# Patient Record
Sex: Female | Born: 1945 | Marital: Married | Smoking: Never smoker
Health system: Northeastern US, Academic
[De-identification: ages and names within clinical notes are randomized; demographics above are authoritative.]

## PROBLEM LIST (undated history)

## (undated) DIAGNOSIS — R188 Other ascites: Secondary | ICD-10-CM

## (undated) DIAGNOSIS — R32 Unspecified urinary incontinence: Secondary | ICD-10-CM

## (undated) DIAGNOSIS — E119 Type 2 diabetes mellitus without complications: Secondary | ICD-10-CM

## (undated) DIAGNOSIS — I10 Essential (primary) hypertension: Secondary | ICD-10-CM

## (undated) DIAGNOSIS — K649 Unspecified hemorrhoids: Secondary | ICD-10-CM

## (undated) DIAGNOSIS — I5189 Other ill-defined heart diseases: Secondary | ICD-10-CM

## (undated) DIAGNOSIS — E785 Hyperlipidemia, unspecified: Secondary | ICD-10-CM

## (undated) DIAGNOSIS — I2510 Atherosclerotic heart disease of native coronary artery without angina pectoris: Secondary | ICD-10-CM

## (undated) DIAGNOSIS — I259 Chronic ischemic heart disease, unspecified: Secondary | ICD-10-CM

## (undated) DIAGNOSIS — N8110 Cystocele, unspecified: Secondary | ICD-10-CM

## (undated) DIAGNOSIS — K219 Gastro-esophageal reflux disease without esophagitis: Secondary | ICD-10-CM

## (undated) DIAGNOSIS — I209 Angina pectoris, unspecified: Secondary | ICD-10-CM

## (undated) DIAGNOSIS — Z9289 Personal history of other medical treatment: Secondary | ICD-10-CM

## (undated) DIAGNOSIS — K7460 Unspecified cirrhosis of liver: Secondary | ICD-10-CM

## (undated) DIAGNOSIS — K228 Other specified diseases of esophagus: Secondary | ICD-10-CM

## (undated) DIAGNOSIS — K2289 Other specified disease of esophagus: Secondary | ICD-10-CM

## (undated) HISTORY — PX: TUBAL LIGATION: SHX77

## (undated) HISTORY — DX: Hyperlipidemia, unspecified: E78.5

## (undated) HISTORY — DX: Other ascites: R18.8

## (undated) HISTORY — DX: Unspecified urinary incontinence: R32

## (undated) HISTORY — DX: Personal history of other medical treatment: Z92.89

## (undated) HISTORY — DX: Atherosclerotic heart disease of native coronary artery without angina pectoris: I25.10

## (undated) HISTORY — DX: Gastro-esophageal reflux disease without esophagitis: K21.9

## (undated) HISTORY — DX: Angina pectoris, unspecified: I20.9

## (undated) HISTORY — DX: Unspecified hemorrhoids: K64.9

## (undated) HISTORY — DX: Essential (primary) hypertension: I10

## (undated) HISTORY — DX: Cystocele, unspecified: N81.10

## (undated) HISTORY — DX: Type 2 diabetes mellitus without complications: E11.9

## (undated) HISTORY — DX: Chronic ischemic heart disease, unspecified: I25.9

## (undated) HISTORY — DX: Other ill-defined heart diseases: I51.89

---

## 2014-08-20 ENCOUNTER — Emergency Department (HOSPITAL_COMMUNITY)
Admission: EM | Admit: 2014-08-20 | Discharge: 2014-08-20 | Disposition: A | Discharge status: Home / Self Care | Payer: Medicare Other | Attending: Emergency Medicine | Admitting: Emergency Medicine

## 2014-08-20 ENCOUNTER — Encounter (HOSPITAL_COMMUNITY): Payer: Self-pay

## 2014-08-20 DIAGNOSIS — H532 Diplopia: Secondary | ICD-10-CM | POA: Insufficient documentation

## 2014-08-20 DIAGNOSIS — E119 Type 2 diabetes mellitus without complications: Secondary | ICD-10-CM | POA: Insufficient documentation

## 2014-08-20 DIAGNOSIS — Z7982 Long term (current) use of aspirin: Secondary | ICD-10-CM | POA: Insufficient documentation

## 2014-08-20 DIAGNOSIS — H5712 Ocular pain, left eye: Secondary | ICD-10-CM | POA: Diagnosis present

## 2014-08-20 DIAGNOSIS — H547 Unspecified visual loss: Secondary | ICD-10-CM | POA: Diagnosis not present

## 2014-08-20 DIAGNOSIS — Z79899 Other long term (current) drug therapy: Secondary | ICD-10-CM | POA: Insufficient documentation

## 2014-08-20 DIAGNOSIS — Z794 Long term (current) use of insulin: Secondary | ICD-10-CM | POA: Diagnosis not present

## 2014-08-20 HISTORY — DX: Type 2 diabetes mellitus without complications: E11.9

## 2014-08-20 MED ORDER — PROPARACAINE HCL 0.5 % OP SOLN
1.0000 [drp] | Freq: Once | OPHTHALMIC | Status: DC
  Filled 2014-08-20: qty 15

## 2014-08-20 MED ORDER — TRAMADOL HCL 50 MG PO TABS: 50.0000 mg | ORAL_TABLET | Freq: Four times a day (QID) | ORAL | Status: DC | PRN

## 2014-08-20 MED ORDER — TRAMADOL HCL 50 MG PO TABS
50.0000 mg | ORAL_TABLET | Freq: Once | ORAL | Status: AC
  Administered 2014-08-20: 50 mg via ORAL
  Filled 2014-08-20: qty 1

## 2014-08-20 MED ORDER — TETRACAINE HCL 0.5 % OP SOLN
OPHTHALMIC | Status: AC
  Administered 2014-08-20: 19:00:00
  Filled 2014-08-20: qty 2

## 2014-08-20 NOTE — ED Provider Notes (Signed)
Medical screening examination/treatment/procedure(s) were conducted as a shared visit with non-physician practitioner(s) and myself.  I personally evaluated the patient during the encounter.   EKG Interpretation None      Patient seen by me. Patient does not wear contacts. Patient with complaint of discomfort to the left eye with some redness. No discharge. No history of foreign body. Symptoms started yesterday. Patient's vision in the right eyes 20/20 left eye 20/25 that is with her glasses on. Ocular pressures are below 20 and both eyes. No evidence of any corneal abrasion. Will refer patient to the ophthalmology. Patient will be treated with tramadol in the meantime for the eye discomfort. Exact cause not clear at this point in time.  Vanetta MuldersScott Micky Sheller, MD 08/20/14 2004

## 2014-08-20 NOTE — ED Provider Notes (Signed)
CSN: [id number]     Arrival date & time 08/20/14  1555 History  This chart was scribed for non-physician practitioner Burgess AmorJulie Nashon Erbes, PA-C working with Vanetta MuldersScott Zackowski, MD by Littie Deedsichard Sun, ED Scribe. This patient was seen in room APFT20/APFT20 and the patient's care was started at 6:01 PM.     Chief Complaint  Patient presents with  . Eye Pain    The history is provided by the patient. No language interpreter was used.   HPI Comments: Lisa Sanford is a 68 y.o. female with glasses and a hx of DM who presents to the Emergency Department complaining of gradual onset, aching, burning left eye pain with redness that began yesterday. Her eye pain was described as throbbing yesterday, but it is now described as aching. She also reports an occasional shooting pain to her left eye when she turns her head. She states her eyes feel "gooey" when she blinks. Patient has had loss of vision and double vision that lasted a few days about 1 year ago; she did see an eye specialist at that time who had concern for possible early glaucoma and cataracts. Patient denies eye drainage, eye itchiness, photophobia, feeling any foreign bodies in her eye, and vision changes. She denies any injury to her eyes. She has NKDA. [place] but she has been here since September visiting her daughter and plans to stay until March.   Past Medical History  Diagnosis Date  . Diabetes mellitus without complication    Past Surgical History  Procedure Laterality Date  . Tubal ligation     No family history on file. History  Substance Use Topics  . Smoking status: Never Smoker   . Smokeless tobacco: Not on file  . Alcohol Use: No   OB History    No data available     Review of Systems  Constitutional: Negative for fever.  HENT: Negative for congestion and sore throat.   Eyes: Positive for pain and redness. Negative for photophobia, discharge, itching and visual disturbance.  Respiratory: Negative for chest  tightness and shortness of breath.   Cardiovascular: Negative for chest pain.  Gastrointestinal: Negative for nausea and abdominal pain.  Genitourinary: Negative.   Musculoskeletal: Negative for joint swelling, arthralgias and neck pain.  Skin: Negative.  Negative for rash and wound.  Neurological: Negative for dizziness, weakness, light-headedness, numbness and headaches.  Psychiatric/Behavioral: Negative.       Allergies  Review of patient's allergies indicates no known allergies.  Home Medications   Prior to Admission medications   Medication Sig Start Date End Date Taking? Authorizing Provider  aspirin EC 81 MG tablet Take 81 mg by mouth daily.   Yes Historical Provider, MD  gabapentin (NEURONTIN) 100 MG capsule Take 200 mg by mouth 3 (three) times daily.   Yes Historical Provider, MD  insulin glargine (LANTUS) 100 UNIT/ML injection Inject 45 Units into the skin 2 (two) times daily.   Yes Historical Provider, MD  isosorbide mononitrate (IMDUR) 30 MG 24 hr tablet Take 30 mg by mouth daily.   Yes Historical Provider, MD  lisinopril (PRINIVIL,ZESTRIL) 10 MG tablet Take 10 mg by mouth daily.   Yes Historical Provider, MD  metFORMIN (GLUCOPHAGE) 850 MG tablet Take 850 mg by mouth 3 (three) times daily.   Yes Historical Provider, MD  metoprolol tartrate (LOPRESSOR) 25 MG tablet Take 12.5 mg by mouth 2 (two) times daily.   Yes Historical Provider, MD  omeprazole (PRILOSEC) 20 MG capsule Take 20 mg  by mouth daily.   Yes Historical Provider, MD  oxybutynin (DITROPAN) 5 MG tablet Take 5 mg by mouth 3 (three) times daily.   Yes Historical Provider, MD  traMADol (ULTRAM) 50 MG tablet Take 1 tablet (50 mg total) by mouth every 6 (six) hours as needed. 08/20/14   Burgess AmorJulie Oseph Imburgia, PA-C   BP 123/82 mmHg  Pulse 86  Temp(Src) 98.6 F (37 C) (Oral)  Resp 20  Ht 5\' 2"  (1.575 m)  Wt 191 lb (86.637 kg)  BMI 34.93 kg/m2  SpO2 98% Physical Exam  Constitutional: She appears well-developed and  well-nourished.  HENT:  Head: Normocephalic and atraumatic.  Eyes: Pupils are equal, round, and reactive to light. Left eye exhibits no chemosis. Left conjunctiva is injected. Left conjunctiva has no hemorrhage. Right eye exhibits normal extraocular motion and no nystagmus. Left eye exhibits normal extraocular motion and no nystagmus.  Slit lamp exam:      The left eye shows no corneal abrasion, no corneal flare, no corneal ulcer, no foreign body and no hyphema.  Eye pressure left 12 and 14,  Right 6 and 9.  Neck: Normal range of motion.  Cardiovascular: Normal rate, regular rhythm, normal heart sounds and intact distal pulses.   Pulmonary/Chest: Effort normal and breath sounds normal. She has no wheezes.  Abdominal: Soft. Bowel sounds are normal. There is no tenderness.  Musculoskeletal: Normal range of motion.  Neurological: She is alert.  Skin: Skin is warm and dry.  Psychiatric: She has a normal mood and affect.  Nursing note and vitals reviewed.   ED Course  Procedures  DIAGNOSTIC STUDIES: Oxygen Saturation is 99% on room air, normal by my interpretation.    COORDINATION OF CARE:    Labs Review Labs Reviewed - No data to display  Imaging Review No results found.   EKG Interpretation None      MDM   Final diagnoses:  Eye pain, left    Pt with left eye pain of unclear etiology.  No trauma, no glaucoma per todays pressure reading.  She was given tramadol for pain relief, advised she needs a recheck by an opthalmologist.  She was referred to Dr. Lita MainsHaines and she knows to call in the am for an appt.   Pt was seen by Dr. Deretha EmoryZackowski during this visit.  I personally performed the services described in this documentation, which was scribed in my presence. The recorded information has been reviewed and is accurate.   Burgess AmorJulie Jerzy Crotteau, PA-C 08/21/14 1335

## 2014-08-20 NOTE — ED Notes (Signed)
Pain, redness lt eye,  No d/c,  No known injury  Rt eye 20/20  Lt eye 20/25  Both 20/25  With glasses.

## 2014-08-20 NOTE — ED Notes (Signed)
Pt c/o left eye irritation and pain since yesterday.  Sclera red, denies drainage.

## 2014-08-20 NOTE — Discharge Instructions (Signed)
You may use the tramadol if needed for pain - use caution as this may make you sleepy.  Call Dr Lita MainsHaines in the morning as discussed for further evaluation of your eye.  Your exam and pressures are normal tonight (no sign of glaucoma)

## 2014-11-13 ENCOUNTER — Other Ambulatory Visit: Payer: Self-pay | Admitting: Gastroenterology

## 2014-12-04 ENCOUNTER — Encounter: Payer: Self-pay | Admitting: Gastroenterology

## 2015-01-14 ENCOUNTER — Other Ambulatory Visit: Payer: Self-pay | Admitting: Gastroenterology

## 2015-10-01 ENCOUNTER — Emergency Department (HOSPITAL_COMMUNITY): Payer: Medicare Other

## 2015-10-01 ENCOUNTER — Emergency Department (HOSPITAL_COMMUNITY)
Admission: EM | Admit: 2015-10-01 | Discharge: 2015-10-01 | Disposition: A | Discharge status: Home / Self Care | Payer: Medicare Other | Attending: Emergency Medicine | Admitting: Emergency Medicine

## 2015-10-01 ENCOUNTER — Encounter (HOSPITAL_COMMUNITY): Payer: Self-pay | Admitting: *Deleted

## 2015-10-01 DIAGNOSIS — M791 Myalgia: Secondary | ICD-10-CM | POA: Insufficient documentation

## 2015-10-01 DIAGNOSIS — Z7984 Long term (current) use of oral hypoglycemic drugs: Secondary | ICD-10-CM | POA: Insufficient documentation

## 2015-10-01 DIAGNOSIS — R39198 Other difficulties with micturition: Secondary | ICD-10-CM | POA: Diagnosis present

## 2015-10-01 DIAGNOSIS — Z794 Long term (current) use of insulin: Secondary | ICD-10-CM | POA: Insufficient documentation

## 2015-10-01 DIAGNOSIS — N390 Urinary tract infection, site not specified: Secondary | ICD-10-CM | POA: Insufficient documentation

## 2015-10-01 DIAGNOSIS — Z79899 Other long term (current) drug therapy: Secondary | ICD-10-CM | POA: Diagnosis not present

## 2015-10-01 DIAGNOSIS — Z8719 Personal history of other diseases of the digestive system: Secondary | ICD-10-CM | POA: Insufficient documentation

## 2015-10-01 DIAGNOSIS — E119 Type 2 diabetes mellitus without complications: Secondary | ICD-10-CM | POA: Insufficient documentation

## 2015-10-01 DIAGNOSIS — Z7982 Long term (current) use of aspirin: Secondary | ICD-10-CM | POA: Diagnosis not present

## 2015-10-01 HISTORY — DX: Unspecified cirrhosis of liver: K74.60

## 2015-10-01 HISTORY — DX: Other specified diseases of esophagus: K22.8

## 2015-10-01 HISTORY — DX: Other specified disease of esophagus: K22.89

## 2015-10-01 LAB — CBC WITH DIFFERENTIAL/PLATELET
Basophils Absolute: 0 10*3/uL (ref 0.0–0.1)
Basophils Relative: 0 %
Eosinophils Absolute: 0.1 10*3/uL (ref 0.0–0.7)
Eosinophils Relative: 1 %
HCT: 34.7 % — ABNORMAL LOW (ref 36.0–46.0)
HEMOGLOBIN: 10.9 g/dL — AB (ref 12.0–15.0)
LYMPHS ABS: 1.2 10*3/uL (ref 0.7–4.0)
LYMPHS PCT: 25 %
MCH: 24.4 pg — AB (ref 26.0–34.0)
MCHC: 31.4 g/dL (ref 30.0–36.0)
MCV: 77.6 fL — AB (ref 78.0–100.0)
MONOS PCT: 12 %
Monocytes Absolute: 0.6 10*3/uL (ref 0.1–1.0)
NEUTROS ABS: 3 10*3/uL (ref 1.7–7.7)
NEUTROS PCT: 62 %
Platelets: 133 10*3/uL — ABNORMAL LOW (ref 150–400)
RBC: 4.47 MIL/uL (ref 3.87–5.11)
RDW: 16.8 % — ABNORMAL HIGH (ref 11.5–15.5)
WBC: 4.9 10*3/uL (ref 4.0–10.5)

## 2015-10-01 LAB — URINALYSIS, ROUTINE W REFLEX MICROSCOPIC
Bilirubin Urine: NEGATIVE
Glucose, UA: NEGATIVE mg/dL
Hgb urine dipstick: NEGATIVE
Ketones, ur: NEGATIVE mg/dL
Nitrite: NEGATIVE
PH: 5.5 (ref 5.0–8.0)
Protein, ur: NEGATIVE mg/dL
SPECIFIC GRAVITY, URINE: 1.02 (ref 1.005–1.030)

## 2015-10-01 LAB — COMPREHENSIVE METABOLIC PANEL
ALBUMIN: 3.3 g/dL — AB (ref 3.5–5.0)
ALT: 21 U/L (ref 14–54)
ANION GAP: 11 (ref 5–15)
AST: 29 U/L (ref 15–41)
Alkaline Phosphatase: 81 U/L (ref 38–126)
BUN: 18 mg/dL (ref 6–20)
CHLORIDE: 97 mmol/L — AB (ref 101–111)
CO2: 30 mmol/L (ref 22–32)
Calcium: 9.2 mg/dL (ref 8.9–10.3)
Creatinine, Ser: 0.77 mg/dL (ref 0.44–1.00)
GFR calc Af Amer: >60 mL/min (ref >60)
GFR calc non Af Amer: >60 mL/min (ref >60)
GLUCOSE: 106 mg/dL — AB (ref 65–99)
POTASSIUM: 4 mmol/L (ref 3.5–5.1)
Sodium: 138 mmol/L (ref 135–145)
Total Bilirubin: 0.4 mg/dL (ref 0.3–1.2)
Total Protein: 7.9 g/dL (ref 6.5–8.1)

## 2015-10-01 LAB — URINE MICROSCOPIC-ADD ON: RBC / HPF: NONE SEEN RBC/hpf (ref 0–5)

## 2015-10-01 MED ORDER — CEPHALEXIN 500 MG PO CAPS: 500.0000 mg | ORAL_CAPSULE | Freq: Four times a day (QID) | ORAL | Status: AC

## 2015-10-01 NOTE — ED Notes (Signed)
Drank a cup of water without difficulty.  Ambulated without difficulty.

## 2015-10-01 NOTE — ED Provider Notes (Signed)
CSN: [id number]     Arrival date & time 10/01/15  1326 History   First MD Initiated Contact with Patient 10/01/15 1617     Chief Complaint  Patient presents with  . Urinary Tract Infection     (Consider location/radiation/quality/duration/timing/severity/associated sxs/prior Treatment) HPI Comments:  Patient reports 4 day history of body aches, chills, burning with urination. States she is urinating a lot. Dysuria improved after having cranberry juice. No fevers but has had chills. No vomiting or diarrhea. No significant abdominal pain, chest pain or shortness of breath. History of diabetes, cirrhosis of unknown etiology. Denies any chest pain or shortness of breath. Denies any leg pain or swelling. No recent travel. No sick contacts. Did not receive a flu shot. Denies any pain right now just generalized body aches.  The history is provided by the patient and a relative.    Past Medical History  Diagnosis Date  . Diabetes mellitus without complication (HCC)   . Esophageal bleeding   . Cirrhosis [facility])    Past Surgical History  Procedure Laterality Date  . Tubal ligation     History reviewed. No pertinent family history. Social History  Substance Use Topics  . Smoking status: Never Smoker   . Smokeless tobacco: None  . Alcohol Use: No   OB History    No data available     Review of Systems  Constitutional: Positive for fatigue. Negative for fever, activity change and appetite change.  HENT: Negative for congestion and rhinorrhea.   Respiratory: Negative for cough, chest tightness and shortness of breath.   Gastrointestinal: Negative for nausea, vomiting and abdominal pain.  Genitourinary: Positive for dysuria and urgency. Negative for hematuria.  Musculoskeletal: Positive for myalgias and arthralgias.  Skin: Negative for rash.  Neurological: Negative for dizziness, weakness and headaches.  A complete 10 system review of systems was obtained and all systems are negative  except as noted in the HPI and PMH.      Allergies  Review of patient's allergies indicates no known allergies.  Home Medications   Prior to Admission medications   Medication Sig Start Date End Date Taking? Authorizing Provider  aspirin EC 81 MG tablet Take 81 mg by mouth daily.    Historical Provider, MD  cephALEXin (KEFLEX) 500 MG capsule Take 1 capsule (500 mg total) by mouth 4 (four) times daily. 10/01/15   Glynn Octave, MD  gabapentin (NEURONTIN) 100 MG capsule Take 200 mg by mouth 3 (three) times daily.    Historical Provider, MD  insulin glargine (LANTUS) 100 UNIT/ML injection Inject 45 Units into the skin 2 (two) times daily.    Historical Provider, MD  isosorbide mononitrate (IMDUR) 30 MG 24 hr tablet Take 30 mg by mouth daily.    Historical Provider, MD  lisinopril (PRINIVIL,ZESTRIL) 10 MG tablet Take 10 mg by mouth daily.    Historical Provider, MD  metFORMIN (GLUCOPHAGE) 850 MG tablet Take 850 mg by mouth 3 (three) times daily.    Historical Provider, MD  metoprolol tartrate (LOPRESSOR) 25 MG tablet Take 12.5 mg by mouth 2 (two) times daily.    Historical Provider, MD  omeprazole (PRILOSEC) 20 MG capsule Take 20 mg by mouth daily.    Historical Provider, MD  oxybutynin (DITROPAN) 5 MG tablet Take 5 mg by mouth 3 (three) times daily.    Historical Provider, MD  traMADol (ULTRAM) 50 MG tablet Take 1 tablet (50 mg total) by mouth every 6 (six) hours as needed. 08/20/14   Burgess Amor,  PA-C   BP 112/58 mmHg  Pulse 60  Temp(Src) 97.6 F (36.4 C) (Oral)  Resp 18  Ht 5\' 1"  (1.549 m)  Wt 173 lb (78.472 kg)  BMI 32.70 kg/m2  SpO2 100% Physical Exam  Constitutional: She is oriented to person, place, and time. She appears well-developed and well-nourished. No distress.  Well appearing, nontoxic. Well hydrated  HENT:  Head: Normocephalic and atraumatic.  Mouth/Throat: Oropharynx is clear and moist. No oropharyngeal exudate.  Eyes: Conjunctivae and EOM are normal. Pupils are  equal, round, and reactive to light.  Neck: Normal range of motion. Neck supple.  No meningismus.  Cardiovascular: Normal rate, regular rhythm, normal heart sounds and intact distal pulses.   No murmur heard. Pulmonary/Chest: Effort normal and breath sounds normal. No respiratory distress.  Abdominal: Soft. There is no tenderness. There is no rebound and no guarding.  Musculoskeletal: Normal range of motion. She exhibits no edema or tenderness.  Neurological: She is alert and oriented to person, place, and time. No cranial nerve deficit. She exhibits normal muscle tone. Coordination normal.  No ataxia on finger to nose bilaterally. No pronator drift. 5/5 strength throughout. CN 2-12 intact.Equal grip strength. Sensation intact.   Skin: Skin is warm.  Psychiatric: She has a normal mood and affect. Her behavior is normal.  Nursing note and vitals reviewed.   ED Course  Procedures (including critical care time) Labs Review Labs Reviewed  URINALYSIS, ROUTINE W REFLEX MICROSCOPIC (NOT AT [facility]) - Abnormal; Notable for the following:    Leukocytes, UA SMALL (*)    All other components within normal limits  CBC WITH DIFFERENTIAL/PLATELET - Abnormal; Notable for the following:    Hemoglobin 10.9 (*)    HCT 34.7 (*)    MCV 77.6 (*)    MCH 24.4 (*)    RDW 16.8 (*)    Platelets 133 (*)    All other components within normal limits  COMPREHENSIVE METABOLIC PANEL - Abnormal; Notable for the following:    Chloride 97 (*)    Glucose, Bld 106 (*)    Albumin 3.3 (*)    All other components within normal limits  URINE MICROSCOPIC-ADD ON - Abnormal; Notable for the following:    Squamous Epithelial / LPF 0-5 (*)    Bacteria, UA FEW (*)    All other components within normal limits  URINE CULTURE    Imaging Review Dg Chest 2 View  10/01/2015  CLINICAL DATA:  Weakness, low-grade fever, UTI EXAM: CHEST  2 VIEW COMPARISON:  None. FINDINGS: Cardiomediastinal silhouette is unremarkable. No acute  infiltrate or pleural effusion. No pulmonary edema. Probable partially calcified granuloma in lingula measures 6.7 mm. Osteopenia and mild degenerative changes thoracic spine. IMPRESSION: No active cardiopulmonary disease. Electronically Signed   By: Natasha Mead M.D.   On: 10/01/2015 17:55   I have personally reviewed and evaluated these images and lab results as part of my medical decision-making.   EKG Interpretation None      MDM   Final diagnoses:  Urinary tract infection without hematuria, site unspecified    4 days of body aches, chills, dysuria. No fever. No vomiting. No abdominal pain or back pain.   Urinalysis consistent with infection. Labs otherwise unremarkable. Mild anemia, no comparison. Blood sugar control. Chest x-ray negative. She states her baseline hemoglobin is in the 10-11 range and was told that "above ten was good".  Denies any dark or bloody stools.  No evidence of acute GI bleed.   Patient tolerating  by mouth and ambulatory. Needs to establish care with PCP. Treat UTI. Return precautions discussed.    Glynn OctaveStephen Dewayne Severe, MD 10/01/15 [phone]

## 2015-10-01 NOTE — ED Notes (Signed)
Pt with body aches, chills and burning with urination x 4 days, denies hx ofUTI

## 2015-10-01 NOTE — Discharge Instructions (Signed)

## 2015-10-03 LAB — URINE CULTURE: Culture: >=100000

## 2015-10-04 ENCOUNTER — Telehealth (HOSPITAL_BASED_OUTPATIENT_CLINIC_OR_DEPARTMENT_OTHER): Payer: Self-pay | Admitting: Emergency Medicine

## 2015-10-04 NOTE — Telephone Encounter (Signed)
Post ED Visit - Positive Culture Follow-up  Culture report reviewed by antimicrobial stewardship pharmacist:  []  [name], Pharm.D. []  [name], [address] BCPS []  [name], Pharm.D. []  [name], Pharm.D., BCPS []  [name], [address] BCPS, AAHIVP []  [name], Pharm.D., BCPS, AAHIVP []  [name], Pharm.D. []  [name], [place]  Positive urine culture Treated with cephalexin, organism sensitive to the same and no further patient follow-up is required at this time.  [name], [name] 10/04/2015, 10:12 AM

## 2015-11-23 ENCOUNTER — Encounter: Payer: Self-pay | Admitting: Gastroenterology

## 2015-11-27 ENCOUNTER — Emergency Department (HOSPITAL_COMMUNITY)
Admission: EM | Admit: 2015-11-27 | Discharge: 2015-11-27 | Disposition: A | Discharge status: Home / Self Care | Payer: Medicare Other | Attending: Emergency Medicine | Admitting: Emergency Medicine

## 2015-11-27 ENCOUNTER — Encounter (HOSPITAL_COMMUNITY): Payer: Self-pay | Admitting: Emergency Medicine

## 2015-11-27 ENCOUNTER — Emergency Department (HOSPITAL_COMMUNITY): Payer: Medicare Other

## 2015-11-27 DIAGNOSIS — Z7982 Long term (current) use of aspirin: Secondary | ICD-10-CM | POA: Insufficient documentation

## 2015-11-27 DIAGNOSIS — Y939 Activity, unspecified: Secondary | ICD-10-CM | POA: Diagnosis not present

## 2015-11-27 DIAGNOSIS — Z7984 Long term (current) use of oral hypoglycemic drugs: Secondary | ICD-10-CM | POA: Insufficient documentation

## 2015-11-27 DIAGNOSIS — K7460 Unspecified cirrhosis of liver: Secondary | ICD-10-CM | POA: Insufficient documentation

## 2015-11-27 DIAGNOSIS — S299XXA Unspecified injury of thorax, initial encounter: Secondary | ICD-10-CM | POA: Insufficient documentation

## 2015-11-27 DIAGNOSIS — R0789 Other chest pain: Secondary | ICD-10-CM | POA: Diagnosis present

## 2015-11-27 DIAGNOSIS — Z9851 Tubal ligation status: Secondary | ICD-10-CM | POA: Diagnosis not present

## 2015-11-27 DIAGNOSIS — Z794 Long term (current) use of insulin: Secondary | ICD-10-CM | POA: Insufficient documentation

## 2015-11-27 DIAGNOSIS — E119 Type 2 diabetes mellitus without complications: Secondary | ICD-10-CM | POA: Diagnosis not present

## 2015-11-27 DIAGNOSIS — Y999 Unspecified external cause status: Secondary | ICD-10-CM | POA: Diagnosis not present

## 2015-11-27 DIAGNOSIS — Y9289 Other specified places as the place of occurrence of the external cause: Secondary | ICD-10-CM | POA: Insufficient documentation

## 2015-11-27 LAB — CBG MONITORING, ED: Glucose-Capillary: 221 mg/dL — ABNORMAL HIGH (ref 65–99)

## 2015-11-27 MED ORDER — METHOCARBAMOL 500 MG PO TABS: 500.0000 mg | ORAL_TABLET | Freq: Four times a day (QID) | ORAL | Status: AC

## 2015-11-27 MED ORDER — ACETAMINOPHEN 325 MG PO TABS
650.0000 mg | ORAL_TABLET | Freq: Once | ORAL | Status: AC
  Administered 2015-11-27: 650 mg via ORAL
  Filled 2015-11-27: qty 2

## 2015-11-27 MED ORDER — TRAMADOL HCL 50 MG PO TABS: 50.0000 mg | ORAL_TABLET | Freq: Four times a day (QID) | ORAL | Status: AC | PRN
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
## 2015-11-27 NOTE — ED Notes (Signed)
Pt states verbal understanding of discharge instructions, medications, and follow up.
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
## 2016-09-28 HISTORY — PX: CORONARY ANGIOPLASTY WITH STENT PLACEMENT: SHX49

## 2016-10-16 ENCOUNTER — Other Ambulatory Visit: Payer: Self-pay | Admitting: Gastroenterology
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
## 2017-11-22 ENCOUNTER — Other Ambulatory Visit: Payer: Self-pay | Admitting: Gastroenterology

## 2017-12-06 ENCOUNTER — Encounter: Payer: Self-pay | Admitting: Gastroenterology

## 2017-12-19 ENCOUNTER — Other Ambulatory Visit: Payer: Self-pay | Admitting: Gastroenterology

## 2017-12-23 ENCOUNTER — Encounter: Payer: Self-pay | Admitting: Gastroenterology

## 2017-12-24 ENCOUNTER — Other Ambulatory Visit: Payer: Self-pay | Admitting: Gastroenterology

## 2018-01-06 ENCOUNTER — Encounter: Payer: Self-pay | Admitting: Gastroenterology

## 2018-01-13 ENCOUNTER — Telehealth: Payer: Self-pay

## 2018-01-13 NOTE — Telephone Encounter (Signed)
Ms. Ival BibleVallance's daughter Irving Burtonmily is calling to schedule an appointment with GI for cirrhosis of liver. Please call the patient to schedule at [phone].

## 2018-01-17 NOTE — Telephone Encounter (Signed)
Called patient and scheduled her for NPV-cirhossis with L Westcott next available.

## 2018-01-24 ENCOUNTER — Encounter: Payer: Self-pay | Admitting: Gastroenterology

## 2018-01-26 NOTE — Progress Notes (Signed)
Pt's self electronic referral was received 12/26/17 for liver transplant referral, and also went to GI.  Reviewed records today as I did not receive her referral. and pt already scheduled at [facility]. Per Diplomatic Services operational officer Amy she had called pt a couple of times requesting records and they never came. I referred referral and her appt can stat at [facility] .  Her labs show creat 0.75, bili 0.9  Na 138 and alb 3.9 with plts 233. Imaging does show ascites.  She also has significant cardiac disease .

## 2018-02-02 ENCOUNTER — Encounter: Payer: Self-pay | Admitting: Gastroenterology

## 2018-02-04 ENCOUNTER — Other Ambulatory Visit: Payer: Self-pay | Admitting: Gastroenterology

## 2018-02-11 ENCOUNTER — Telehealth: Payer: Self-pay | Admitting: Gastroenterology

## 2018-02-11 ENCOUNTER — Ambulatory Visit
Admission: RE | Admit: 2018-02-11 | Discharge: 2018-02-11 | Disposition: A | Discharge status: Home / Self Care | Payer: Medicare Other | Source: Ambulatory Visit | Attending: Gastroenterology | Admitting: Gastroenterology

## 2018-02-11 ENCOUNTER — Encounter: Payer: Self-pay | Admitting: Gastroenterology

## 2018-02-11 ENCOUNTER — Other Ambulatory Visit
Admission: RE | Admit: 2018-02-11 | Discharge: 2018-02-11 | Disposition: A | Discharge status: Home / Self Care | Payer: Medicare Other | Source: Ambulatory Visit | Attending: Gastroenterology | Admitting: Gastroenterology

## 2018-02-11 VITALS — BP 96/52 | HR 72 | Ht 62.0 in | Wt 138.0 lb

## 2018-02-11 DIAGNOSIS — K7460 Unspecified cirrhosis of liver: Secondary | ICD-10-CM | POA: Insufficient documentation

## 2018-02-11 LAB — RBC MORPHOLOGY

## 2018-02-11 LAB — TIBC
Iron: 18 ug/dL — ABNORMAL LOW (ref 34–165)
TIBC: 459 ug/dL — ABNORMAL HIGH (ref 250–450)
Transferrin Saturation: 4 % — ABNORMAL LOW (ref 15–50)

## 2018-02-11 LAB — CBC AND DIFFERENTIAL
Baso # K/uL: 0.1 10*3/uL (ref 0.0–0.1)
Basophil %: 0.9 %
Eos # K/uL: 0.1 10*3/uL (ref 0.0–0.4)
Eosinophil %: 0.9 %
Hematocrit: 28 % — ABNORMAL LOW (ref 34–45)
Hemoglobin: 8.5 g/dL — ABNORMAL LOW (ref 11.2–15.7)
IMM Granulocytes #: 0 10*3/uL
IMM Granulocytes: 0.2 %
Lymph # K/uL: 1.1 10*3/uL — ABNORMAL LOW (ref 1.2–3.7)
Lymphocyte %: 19.5 %
MCH: 21 pg/cell — ABNORMAL LOW (ref 26–32)
MCHC: 30 g/dL — ABNORMAL LOW (ref 32–36)
MCV: 69 fL — ABNORMAL LOW (ref 79–95)
Mono # K/uL: 0.5 10*3/uL (ref 0.2–0.9)
Monocyte %: 9.7 %
Neut # K/uL: 3.8 10*3/uL (ref 1.6–6.1)
Nucl RBC # K/uL: 0 10*3/uL (ref 0.0–0.0)
Nucl RBC %: 0 /100 WBC (ref 0.0–0.2)
Platelets: 219 10*3/uL (ref 160–370)
RBC: 4.1 MIL/uL (ref 3.9–5.2)
RDW: 22.9 % — ABNORMAL HIGH (ref 11.7–14.4)
Seg Neut %: 68.8 %
WBC: 5.6 10*3/uL (ref 4.0–10.0)

## 2018-02-11 LAB — PROTIME-INR
INR: 1.3 — ABNORMAL HIGH (ref 0.9–1.1)
Protime: 14.3 s — ABNORMAL HIGH (ref 10.0–12.9)

## 2018-02-11 LAB — COMPREHENSIVE METABOLIC PANEL
ALT: 17 U/L (ref 0–35)
AST: 36 U/L — ABNORMAL HIGH (ref 0–35)
Albumin: 3.4 g/dL — ABNORMAL LOW (ref 3.5–5.2)
Alk Phos: 101 U/L (ref 35–105)
Anion Gap: 17 — ABNORMAL HIGH (ref 7–16)
Bilirubin,Total: 0.7 mg/dL (ref 0.0–1.2)
CO2: 29 mmol/L — ABNORMAL HIGH (ref 20–28)
Calcium: 9.7 mg/dL (ref 8.6–10.2)
Chloride: 89 mmol/L — ABNORMAL LOW (ref 96–108)
Creatinine: 1.06 mg/dL — ABNORMAL HIGH (ref 0.51–0.95)
GFR,Black: 62 *
GFR,Caucasian: 53 * — AB
Glucose: 247 mg/dL — ABNORMAL HIGH (ref 60–99)
Lab: 24 mg/dL — ABNORMAL HIGH (ref 6–20)
Potassium: 4.7 mmol/L (ref 3.3–5.1)
Sodium: 135 mmol/L (ref 133–145)
Total Protein: 7.7 g/dL (ref 6.3–7.7)

## 2018-02-11 LAB — ALPHA-1-ANTITRYPSIN: A-1 Antitrypsin: 193 mg/dL (ref 90–200)

## 2018-02-11 LAB — AFP TUMOR MARKER: AFP (eff. 4-2010): 1 IU/mL (ref 0–7)

## 2018-02-11 LAB — CERULOPLASMIN: Ceruloplasmin: 36 mg/dL (ref 16–45)

## 2018-02-11 LAB — FERRITIN: Ferritin: 10 ng/mL (ref 10–120)

## 2018-02-11 NOTE — H&P (Signed)
Cheyenne Butler  [id number]  [birth year]    Cheyenne Mars, FNP  [address]  [place], [place] [address]  Phone: [phone]  Fax: [phone]     We had the pleasure of seeing Cheyenne Butler in our Gastroenterology and Hepatology clinic on 02/11/18. As you know, she is a 72 y.o. female who presents for further evaluation of cirrhosis, complicated by portal HTN and esophageal varices with history of variceal bleeding, as well as fluid retention issues (ascites, LE edema) and PSE.  It appears she had been referred for TXP evaluation here, though was scheduled here at [facility]. It appears that TXP team had reviewed records, and recommended that she keep this assessment today.  PMH is significant for underlying cardiac disease, including MI status post stent placement x 2 in 09/2016, DM and bladder prolapse.  She reports having been diagnosed with cirrhosis of the liver in 2016. She states that her local providers are insistent that her liver disease is related to EtOH use, though she states that she has never been a heavy drinker. She states that she has been assessed for other types of hepatitis locally, though this was reportedly negative.  She has never undergone liver biopsy previously; this has been offered, though she has historically refused this.  She is accompanied by many family members today. She is hoping to discuss liver transplantation today.  She was diagnosed in 2016 related to esophageal variceal bleeding, status post banding. She has not had any recurrent GI bleeding since that time. EGD is planned for August; she has not had repeat scope since 2016.  She has also had escalating fluid retention issues over the last 6 months. She has had paracentesis previously, and was started on diuretics. She has not since require recurrent paracentesis. She is maintained in Spironolactone 75 mg daily, as well as Furosemide 60 mg daily. She has had some hypokalemia, and is also on 20 meq of K daily as well. She has  been following a low sodium diet as well.  She has had escalating issues also with forgetfulness. She is on Xifaxan 550 mg; she has not been on Lactulose.  She has lost significant weight, related to fluid losses, as well as loss of muscle mass.  BMs are daily. She denies any hard stools or straining. She offers no associated bleeding or melena. BMs have been stable on fiber supplement (Metamucil).  She has discussed liver transplantation with local MD, though has not been referred due to low MELD; the family has requested this assessment due to their concerns regarding this and number of questions they have.  Per the patient, her last MELD was 11.  Last imaging was completed within the last 6 months related to fluid retention. She had not been undergoing HCC Screening every 6 months prior to this.    Primary GI: Chinyere Nwosu    ALLERGIES:  Allergies   Allergen Reactions    Sulfa Antibiotics Hives       MEDICATIONS:  Current Outpatient Prescriptions   Medication Sig    potassium chloride 20 MEQ/15ML (10%) solution Take 20 mEq by mouth daily    aspirin (ASPIRIN EC) 81 MG EC tablet Take 81 mg by mouth daily    insulin glargine (LANTUS) 100 UNIT/ML injection vial Inject 45 Units into the skin nightly       oxybutynin (DITROPAN) 5 MG tablet Take 5 mg by mouth 3 times daily    metFORMIN (GLUCOPHAGE) 850 MG tablet Take 850 mg  by mouth 3 times daily       pantoprazole (PROTONIX) 40 MG EC tablet Take 40 mg by mouth 2 times daily   Swallow whole. Do not crush, break, or chew.     gabapentin (NEURONTIN) 400 MG capsule Take 400 mg by mouth 2 times daily       metoprolol (TOPROL-XL) 25 MG 24 hr tablet Take 50 mg by mouth 2 times daily   Do not crush or chew. May be divided.     furosemide (LASIX) 40 MG tablet Take 60 mg by mouth every morning       atorvastatin (LIPITOR) 40 MG tablet Take 40 mg by mouth daily    nitroglycerin (NITROSTAT) 0.4 MG SL tablet Place 0.4 mg under the tongue every 5 minutes as needed for  Chest pain   May repeat 2 times then call 911 if pain persists.    spironolactone (ALDACTONE) 50 MG tablet Take 75 mg by mouth daily       rifAXIMin (XIFAXAN) 550 MG tablet Take 550 mg by mouth 2 times daily    hydrocortisone (ANUSOL-HC) 25 MG suppository Place 25 mg rectally 2 times daily    benzonatate (TESSALON) 200 MG capsule Take 200 mg by mouth 3 times daily as needed for Cough    clopidogrel (PLAVIX) 75 MG tablet Take 75 mg by mouth daily       MEDICAL HISTORY:  Past Medical History:   Diagnosis Date    Angina pectoris     Bladder prolapse, female, acquired     CAD (coronary artery disease)     Jan 2018 - stents placed (2)    Cirrhosis of liver with ascites, unspecified hepatic cirrhosis type     DM (diabetes mellitus), type 2     Dyslipidemia     Esophageal reflux     Grade II diastolic dysfunction     Hemorrhoid     History of blood transfusion     Hypertension     Ischemic heart disease     Urinary incontinence        SURGICAL HISTORY:  Past Surgical History:   Procedure Laterality Date    CARDIAC CATHETERIZATION  10/2010    CORONARY ANGIOPLASTY WITH STENT PLACEMENT  09/2016    TUBAL LIGATION      variceal banding         FAMILY HISTORY:  Family History   Problem Relation Age of Onset    Colon cancer Brother        SOCIAL HISTORY:  Social History     Social History    Marital status: Married     Spouse name: N/A    Number of children: N/A    Years of education: N/A     Social History Main Topics    Smoking status: Never Smoker    Smokeless tobacco: Never Used    Alcohol use No    Drug use: No    Sexual activity: Not Asked     Other Topics Concern    None     Social History Narrative    None       REVIEW OF SYSTEMS:  General:  No malaise, fatigue, fever, chills, sweats.    HEENT:  No tinnitus, coryza, diplopia, dysgeusia.    Cardiovascular:  No chest pain, palpitations.    Respiratory:  No dyspnea, cough.    Musculoskeletal:  No myalgias.    GI:  See above.    GU:  No  dysuria,  hematuria.    Skin:  No rash, pruritus, jaundice.    Neuro:   No focal numbness, weakness, tremor.    Psychiatric:  No somnolence, confusion, dysphoria.    Endocrine:  No heat or cold intolerance.    Heme/Lymph:  No easy bruising/bleeding.  No concerning lumps.    Allergy/Rheum:  No arthralgias/arthritis.  No rash/hives.     PHYSICAL EXAMINATION:  Vitals:    02/11/18 0922   BP: 96/52   Pulse: 72   Weight: 62.6 kg (138 lb)   Height: 157.5 cm ( )       General: This is a chronically ill appearing, older woman in no acute distress.  Skin:  No rashes, jaundice.    HEENT: Sclerae are anicteric, oral mucosa are pink and moist.  No oropharyngeal abnormalities.    Neck:  No masses, tracheal deviation.    Lungs:  Clear bilaterally to auscultation. Normal respiratory effort.    Cor:  RRR without murmur, rub, or gallop.  No heave or thrill.    Abdomen: Non tender, non distended. Normoactive bowel sounds. No appreciable organomegaly.    Extremities:  Warm, no edema.  Neuro:  Alert and oriented x 3 with appropriate affect.    Ambulatory:  No gross motor deficits or ataxia/dysarthria noted. Ambulates without assistive device.    LABS, IMAGING AND PROCEDURES:  Numerous Pages of Scanned Documents reviewed in "Media."               CT Abdomen/Pelvis (11/22/17)       IMPRESSION AND RECOMMENDATIONS:  We had the pleasure of seeing Cheyenne Butler in our Gastroenterology and Hepatology clinic on 02/11/18. As you know, she is a 72 y.o. female who presents for further evaluation of cirrhosis, complicated by portal HTN and esophageal varices with history of variceal bleeding, as well as fluid retention issues (ascites, LE edema) and PSE. It appears she had been referred for TXP evaluation here, though was scheduled here at [facility]. It appears that TXP team had reviewed records, and recommended that she keep this assessment today. PMH is significant for underlying cardiac disease, including MI status post stent placement x 2 in  09/2016, DM and bladder prolapse. Etiology of cirrhosis is unclear, per patient attributed to ASH/NASH, despite no history of EtOH use. She has discussed liver transplantation with local MD, though has not been referred due to low MELD; the family has requested this assessment due to their concerns regarding this and number of questions they have. Per the patient, her last MELD was 11. Last imaging was completed within the last 6 months related to fluid retention. She had not been undergoing HCC Screening every 6 months prior to this.  Our recommendations at this time include the following:    -Update labs today:  --CBC, CMP  --Assess complete serologic chronic liver disease work-up to evaluate for underlying, treatable cause of liver disease, which may improve features of decompensation with active treatment  -May need to consider liver biopsy  -Continue current diuretics unchanged; recommend low sodium diet, ideally less than 2 g sodium daily  --Consider diuretic adjustments pending renal function and electrolyte tolerance  --May also consider IV albumin infusions  -Recommend every 6 month abdominal imaging at least, for [facility] Screening  --Consider limitations of U/S in the setting of ascites, +/- fatty/cirrhotic liver  --CT 10/2017 without concerning lesions noted; review of report actually indicates no comment of liver  -Planning to have EGD with local provider upcoming; consider sooner assessment given  history of varices, as well as noted anemia on labs  --Also evaluate need for colonoscopy  -Continue Xifaxan; consider adding Lactulose  -We did have extensive discussions regarding TXP; we reviewed deceased and live donor options  --We did review current low MELD, despite degree of symptoms may preclude deceased donor evaluations at this time  --Can consider liver donor assessment; will refer to TXP service  -Follow-up in office in 2-3 months with attending MD (Dr.Werth)    Thank you for allowing [place] to participate  in the care of this patient. Please do not hesitate to contact our office in the future for any questions or concerns at [phone].    Elenore Rota, PA

## 2018-02-11 NOTE — Telephone Encounter (Signed)
Marcelino Duster, patient's daughter, is requesting office notes or records from the patient's visit on 02/11/18, with L. Westcott. Reason for record is patient's GI doctor will be performing a CEN soon. She would like the information to be faxed to [phone] ATTN Dr. Docia Furl. If necessary, Marcelino Duster can be reached at [phone].

## 2018-02-12 LAB — HEPATITIS B CORE ANTIBODY, TOTAL: HBV Core Ab: NEGATIVE

## 2018-02-12 LAB — HEPATITIS A IGG AB: Hepatitis A IGG: POSITIVE

## 2018-02-12 LAB — HEPATITIS B SURFACE ANTIGEN: HBV S Ag: NEGATIVE

## 2018-02-12 LAB — HEPATITIS C ANTIBODY: Hep C Ab: NEGATIVE

## 2018-02-12 LAB — HEB B INT

## 2018-02-12 LAB — HEPATITIS B SURFACE ANTIBODY
HBV S Ab Quant: 0.01 m[IU]/mL
HBV S Ab: NEGATIVE

## 2018-02-14 LAB — F-ACTIN ANTIBODY: F-Actin IgG: 19 Units (ref 0–19)

## 2018-02-14 LAB — MITOCHONDRIAL ANTIBODIES, M2: Mitochondrial Ab: 5 Units (ref 0.0–20.0)

## 2018-02-15 LAB — ANTINUCLEAR ANTIBODY SCREEN: ANA Screen: POSITIVE — AB

## 2018-02-15 LAB — ALPHA 1 ANTITRYPSIN PHENOTYPE (PI): ALPHA 1 ANTITRYPSIN: 183 mg/dL (ref 90–200)

## 2018-02-15 LAB — ANA TITER/PATTERN: ANA Titer: 640 — AB

## 2018-02-18 NOTE — Telephone Encounter (Signed)
Misty Stanley with the office of Dr. Alford Black Diamond, is requesting office notes or records from the patient's visit on 02/11/2018, with Rosana Fret. She would like the information to be faxed to [phone] Shelda Jakes. If necessary, Misty Stanley can be reached at [phone].

## 2018-03-09 NOTE — Telephone Encounter (Signed)
Patient's daughter, Irving Burtonmily, requesting call back from [employer].  Patient is scheduled with Dr. Consuello MasseNwosu a CEN and they want to know if there is anything particular she would like them to look for.  Patient is scheduled for 04/19/18 for the procedure.  She can be reached [phone]

## 2018-03-09 NOTE — Telephone Encounter (Signed)
Note signed and electronically forwarded.  Elenore RotaLauren E Beck Cofer, PA

## 2018-05-12 ENCOUNTER — Inpatient Hospital Stay: Admit: 2018-05-12 | Discharge: 2018-05-12 | Disposition: A | Discharge status: Home / Self Care | Payer: Self-pay

## 2018-05-19 ENCOUNTER — Ambulatory Visit
Admission: RE | Admit: 2018-05-19 | Discharge: 2018-05-19 | Disposition: A | Discharge status: Home / Self Care | Payer: Medicare Other | Source: Ambulatory Visit | Attending: Gastroenterology | Admitting: Gastroenterology

## 2018-05-19 ENCOUNTER — Encounter: Payer: Self-pay | Admitting: Gastroenterology

## 2018-05-19 VITALS — BP 95/55 | HR 53 | Ht 62.0 in | Wt 140.0 lb

## 2018-05-19 DIAGNOSIS — K7460 Unspecified cirrhosis of liver: Secondary | ICD-10-CM | POA: Insufficient documentation

## 2018-05-19 NOTE — Progress Notes (Addendum)
Cheyenne Butler  [id number]      05/19/2018      [name], FNP     Dr. Dion Body, local GI     I had the pleasure of seeing your patient Cheyenne Butler in the outpatient gastroenterology/hepatology clinic. As you know, she is a 72 y.o. female who presents with a history of cirrhosis, complicated by portal HTN and esophageal varices with history of variceal bleeding, fluid retention issues (ascites, LE edema) and PSE.  PMHx of cardiac disease, including MI status post stent placement x 2 in 09/2016, DM and bladder prolapse. Last seen in the clinic 02/11/2018.      Patient presents via rolling walker accompanied by daughter, Vic Ripper and son Jason Fila.     Patient and family report patient with multiple hospitalizations since seen at [facility]. Reports patient with worsening renal function, PSE. UTI, Patient and family brought records from her hospitalization. She underwent a colonoscopy 03/24/2018 at [facility] revealed rectal varices, internal hemorrhoids. 2 mm hepatic flexure polyp removed. (repeat colonoscopy due in 5 years). EGD 6/27 showed non bleeding grade 2 varices, 4 bands placed, moderate portal HTN noted. EGD 8/9 showed grade 2 varices, with bands placed. Repeat EGD 05/16/2018 showed oozing from previous banding site, no other varices noted. Multiple bleeding erosions noted in stomach: c/w bleeding erosive gastropathy.  CT A/P without contrast 05/12/2018 showed small to moderate ascites, gallbladder stones noted.     Labs 02/11/2018 with MELD 10. Serologic chronic liver disease work-up to evaluate for underlying, treatable cause of liver disease was completed with these labs and was unrevealing.     Denies nausea, vomiting, jaundice, icterus. Patient reports ascites and lower extremity edema.  LVP 2-3 months ago. Was recently admitted, lasix was stopped, spironolactone decreased to 25 mg daily. States is following a low sodium diet. Reports 3 admissions within the last 3  months at [facility] for PSE. Is taking Xifaxan 550 mg PO BID. Taking lactulose 4 times daily with frequent loose stools.     Family reports patient with UTI, being treated with antibiotics. Currently no confusion. Reports no hematemesis, has hematuria. Weight is stable. No melena or hematochezia.     Vitals:   Vitals:    05/19/18 1405   BP: 95/55   Pulse: 53   Weight: 63.5 kg (140 lb)   Height: 157.5 cm (5' 2" )     Weight:   Wt Readings from Last 3 Encounters:   05/19/18 63.5 kg (140 lb)   02/11/18 62.6 kg (138 lb)       Allergies/Sensitivities:  Allergies   Allergen Reactions    Sulfa Antibiotics Hives      Medications:   Current Outpatient Prescriptions   Medication    potassium chloride 20 MEQ/15ML (10%) solution    aspirin (ASPIRIN EC) 81 MG EC tablet    insulin glargine (LANTUS) 100 UNIT/ML injection vial    oxybutynin (DITROPAN) 5 MG tablet    metFORMIN (GLUCOPHAGE) 850 MG tablet    pantoprazole (PROTONIX) 40 MG EC tablet    gabapentin (NEURONTIN) 400 MG capsule    metoprolol (TOPROL-XL) 25 MG 24 hr tablet    furosemide (LASIX) 40 MG tablet    atorvastatin (LIPITOR) 40 MG tablet    nitroglycerin (NITROSTAT) 0.4 MG SL tablet    hydrocortisone (ANUSOL-HC) 25 MG suppository    spironolactone (ALDACTONE) 50 MG tablet    rifAXIMin (XIFAXAN) 550 MG tablet    benzonatate (TESSALON) 200 MG capsule  clopidogrel (PLAVIX) 75 MG tablet     No current facility-administered medications for this encounter.         Past Medical History:   Diagnosis Date    Angina pectoris     Bladder prolapse, female, acquired     CAD (coronary artery disease)     Jan 2018 - stents placed (2)    Cirrhosis of liver with ascites, unspecified hepatic cirrhosis type     DM (diabetes mellitus), type 2     Dyslipidemia     Esophageal reflux     Grade II diastolic dysfunction     Hemorrhoid     History of blood transfusion     Hypertension     Ischemic heart disease     Urinary incontinence        ROS:   General:   No malaise, fatigue, fever, chills.  HEENT:  No tinnitus, coryza, diplopia, dysgeusia.  Cardiovascular:  No chest pain, palpitations.  Respiratory:  No dyspnea, cough.  GI:  See above. Skin:  No rash, pruritus, jaundice.  Neuro:   No focal numbness, weakness, tremor.  Psychiatric:  No somnolence, confusion, dysphoria.  Heme/Lymph:  No easy bruising/bleeding.  No concerning lumps.  Allergy/Rheum:  No arthralgias/arthritis.  No rash/hives.       Physical Exam:   General: Well appearing female, NAD, using a rolling walker  HEENT: sclera anicteric. Oral mucosa pink and moist.   Lungs: Clear to auscultation.    Cor: RRR without murmur.    Abdomen: NABS, no distention or tenderness. No hepatosplenomegaly, hernias, masses, or ascites.   Extr: Warm, well-perfused. Lower extremity edema.    Neuro: A and O x 3.  Grossly no focal motor deficits. No tremors, no asterixis. Speech is clear.   Skin: No jaundice, rash, or ulcers.      Labs/Imaging:       Ref. Range 02/11/2018 11:47   Sodium Latest Ref Range: 133 - 145 mmol/L 135   Potassium Latest Ref Range: 3.3 - 5.1 mmol/L 4.7   Chloride Latest Ref Range: 96 - 108 mmol/L 89 (L)   CO2 Latest Ref Range: 20 - 28 mmol/L 29 (H)   Anion Gap Latest Ref Range: 7 - 16  17 (H)   UN Latest Ref Range: 6 - 20 mg/dL 24 (H)   Creatinine Latest Ref Range: 0.51 - 0.95 mg/dL 1.06 (H)   GFR,Black Latest Units: * 62   GFR,Caucasian Latest Units: * 53 (!)   Glucose Latest Ref Range: 60 - 99 mg/dL 247 (H)   Calcium Latest Ref Range: 8.6 - 10.2 mg/dL 9.7   Total Protein Latest Ref Range: 6.3 - 7.7 g/dL 7.7   Albumin Latest Ref Range: 3.5 - 5.2 g/dL 3.4 (L)   ALT Latest Ref Range: 0 - 35 U/L 17   AST Latest Ref Range: 0 - 35 U/L 36 (H)   Alk Phos Latest Ref Range: 35 - 105 U/L 101   Bilirubin,Total Latest Ref Range: 0.0 - 1.2 mg/dL 0.7   Iron Latest Ref Range: 34 - 165 ug/dL 18 (L)   TIBC Latest Ref Range: 250 - 450 ug/dL 459 (H)   Transferrin Saturation Latest Ref Range: 15 - 50 % 4 (L)   Ferritin  Latest Ref Range: 10 - 120 ng/mL 10   Ceruloplasmin Latest Ref Range: 16 - 45 mg/dL 36   Protime Latest Ref Range: 10.0 - 12.9 sec 14.3 (H)   INR Latest Ref Range: 0.9 - 1.1  1.3 (H)   WBC Latest Ref Range: 4.0 - 10.0 THOU/uL 5.6   RBC Latest Ref Range: 3.9 - 5.2 MIL/uL 4.1   Hemoglobin Latest Ref Range: 11.2 - 15.7 g/dL 8.5 (L)   Hematocrit Latest Ref Range: 34 - 45 % 28 (L)   MCV Latest Ref Range: 79 - 95 fL 69 (L)   MCH Latest Ref Range: 26 - 32 pg/cell 21 (L)   MCHC Latest Ref Range: 32 - 36 g/dL 30 (L)   RDW Latest Ref Range: 11.7 - 14.4 % 22.9 (H)   Platelets Latest Ref Range: 160 - 370 THOU/uL 219   Neut # K/uL Latest Ref Range: 1.6 - 6.1 THOU/uL 3.8   Lymph # K/uL Latest Ref Range: 1.2 - 3.7 THOU/uL 1.1 (L)   Mono # K/uL Latest Ref Range: 0.2 - 0.9 THOU/uL 0.5   Eos # K/uL Latest Ref Range: 0.0 - 0.4 THOU/uL 0.1   Baso # K/uL Latest Ref Range: 0.0 - 0.1 THOU/uL 0.1   IMM Granulocytes # Latest Units: THOU/uL 0.0   Nucl RBC # K/uL Latest Ref Range: 0.0 - 0.0 THOU/uL 0.0   Seg Neut % Latest Units: % 68.8   Lymphocyte % Latest Units: % 19.5   Monocyte % Latest Units: % 9.7   Eosinophil % Latest Units: % 0.9   Basophil % Latest Units: % 0.9   IMM Granulocytes Latest Units: % 0.2   Nucl RBC % Latest Ref Range: 0.0 - 0.2 /100 WBC 0.0   RBC Morphology Unknown RESULTS   Anisocytosis Unknown Present (!)   Microcytes Unknown Present (!)   Poikilocytes Unknown Present (!)   A1A Phenotype Unknown M1M1   ANA Screen Latest Ref Range: NEGATIVE  POS (!)   ANA Pattern Unknown SPECKLED   ANA Titer Unknown 640 (!)   F-Actin IgG Latest Ref Range: 0 - 19 Units 19   Mitochondrial Ab Latest Ref Range: 0.0 - 20.0 Units 5.0   AFP (eff. 12-2008) Latest Ref Range: 0 - 7 IU/mL 1   HBV S Ag Unknown NEG   Hepatitis A IGG Unknown POS   HBV S Ab Unknown NEG   HBV S Ab Quant Latest Units: mIU/mL 0.01   HBV Core Ab Unknown NEG   HBV Interp Unknown see below   Hep C Ab Unknown NEG   ALPHA 1 ANTITRYPSIN Latest Ref Range: 90 - 200 mg/dL 183   A-1  Antitrypsin Latest Ref Range: 90 - 200 mg/dL 193      Impression(s)/Recommendation(s):    Jaclynne Baldo is a 72 y.o. 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 Presents for follow up.  she  does not exhibit any acute GI or liver related symptoms. Recommendations include:     1. Indeterminate Cirrhosis:  -Complicated by easily induced PSE, variceal bleeding, ascites, low albumin, worsening renal function, rising MELD  - MELD-Na 18  - Will refer to Transplant for evaluation  - She has cardiac stents in place, will need a cardiac evaluation with her evaluation  - Referral to transplant placed  - Local GI: Dr. Modesto Charon  - She reports having been diagnosed with cirrhosis of the liver in 2016. She states that her local providers are insistent that her liver disease is related to ETOH use, though she states that she has never been a heavy drinker. She states that she has been assessed for other types of hepatitis locally, though this was  reportedly negative. Has never undergone liver biopsy previously, has been offered, she has declined  - Check ethyly gluconoride level  - Labs: CMP, CBC, PT/INR    2. Ascites/Edema:    - Off lasix due to rising creatinine. Spironolactone decreased to 25 mg daily, which she continues  - Recent CT shows moderate ascites. Low albumin (2.7)  - Recommend that she start IV albumin infusions 25% with 50 g IV once weekly x 4, then re-evaluate for ongoing need- this should be arranged by local GI/PCP to be done locally given distance from [facility].   - Stay on spironolactone     3. Hepatic encephalopathy:   - Currently stable.   - Continue Xifaxan 550 mg twice daily  - Continue lactulose QID    4. Esophageal Variceal Surveillance:  -  Schedule repeat EGD with Dr. Arrie Eastern    5. Hepatocellular carcinoma surveillance:  -  Due for repeat imaging February 2019, update AFP       Thank you for allowing [place] to participate in  this patient's care.  Please do not hesistate to contact us with any questions or concerns at [phone].        Maris Berger, NP      Attending note    Pertinent History/Assessment:  Cirrhosis with decompensation, renal insuff  MELD Na 18  Ascites  Encephalopathy  Varices  CAD    Pertinent Physical Examination: General appearance: Pleasant, conversant, alert, asking appropriate questions.  Abdomen: soft, distended;  Neurologic: CN intact, MAE normally, sensory grossly intact    Patient was seen, evaluated, counseled in detail by me regarding the assessment above and plans below (20 min total time in room with patient for face to face), plus chart review, as follows: the patient's review of systems and the patient's records, as well as relevant images/labs, were included in the review with the patient. All questions answered prior to departure.    Plan:   IV albumin  Transplant referral  Lactulose and diuretics  Check ethyly gluconoride level, CMP, CBC, PT/INR        Nelly Laurence, MD  Associate Professor of Clinical Medicine  Attending Physician, Division of Gastroenterology and Hepatology, [facility]

## 2018-05-19 NOTE — Discharge Instructions (Signed)
Weekly albumin infusions, 50 g, for four weeks  Lab and urine testing   refer to Liver Transplant surgery  Notify your cardiologist of the referral-- may need his approval for surgery and or records  [facility] (OK with me)        Division of Gastroenterology and Hepatology    END[facility] INSTRUCTIONS AT [facility]      Date: 06/30/18 Time: 9:15am   [facility]   Dr. Octavio Graveswerth   Plan to be here for 3 hours Take the Silver Elevators to the 4th Floor, G.I. Procedures Suite      Thank you for choosing [facility] [place] [facility] for your endoscopic procedure.  In an effort to provide you with the best possible exam, we will need your assistance with the following instructions    If you believe you may be pregnant or need to cancel your appointment for any reason, please call our office at least 48 hours prior to your scheduled appointment at [phone].    If you take any of the following medications, you must be seen in the office at least two weeks BEFORE your procedure, in order to receive careful instructions on how to manage your medications.    Coumadin (warfarin)   Plavix (clopidogrel)   Ticlid (ticlopidine)    Lovenox (enoxaparin)        Heparin    Aggrenox (aspirin and dipyridamole)  Effient (prasugrel)    Additionally, if you have any of the conditions listed immediately below, then you must be seen in the office at least two weeks BEFORE your procedure, in order to receive careful evaluation and instructions.     If you take any drug for the purpose of being anti-coagulated, keeping your blood thin, or preventing blood clots;    If you are being treated or have been treated for stroke or transient ischemic attacks (TIAs), or mini-strokes;   If you have artificial heart valves, or coronary artery or carotid artery stents     If you take any medications for diabetes, by injection or oral tablets, then you must be seen in the office at  least two weeks BEFORE your procedure, in order to receive careful instructions on how to manage these medications.  Examples of medication  - Glucophage,  Metformin, glipizide, actos, Lantus, Humulin, or any other insulin.    Please note that aspirin is no longer routinely discontinued prior to our procedures, unless you receive specific instructions to do so.     Unless otherwise instructed, continue to take all of your usual medications on your regular schedule.  If you have an early morning procedure, please bring your medications with you to take after the procedure.     If you need an office appointment, or if you need to review your instructions, please call   [phone] as soon as possible.       ON THE DAY OF YOUR PROCEDURE:       DO NOT EAT ANY SOLID FOOD THE DAY OF YOUR PROCEDURE    Clear liquid diet only until 4 hours prior to your scheduled arrival time.- after that time, nothing by mouth.     CLEAR LIQUID DIET:  YOU MAY HAVE (examples):       DO NOT  HAVE (examples):   Clear soda (ginger ale, Sprite)                      Red or purple Jell-O   Clear juice (apple, white cranberry)               Beef broth   Gatorade/Powerade (clear or yellow)            Alcohol   Chicken broth (nothing in it)                           Milk or milk products   Black Coffee/Tea (no cream)                         Yogurt or pudding   Jell-O (yellow, orange, green)                        Fudgsicles   Popsicles (not red or purple)                          Cream soups     Please arrive on time. Allow extra time in your trip for weather, traffic and parking. Parking is validated for the hospitals garage at the time of your discharge.   Please bring a list of all the medications you take, including dosages and how often each medication is taken.   Also list any herbal or vitamin supplements you take.  When you check in you will be asked if you have brought that list, if you have  not, we will ask you to create the medication list at that time.  Having an up to date list of all your medications helps [place] provide safe patient care.   Personal belongings:   On the day of your sedated procedure, we strongly recommend that you leave valuables (money/jewelry) at home or give them to a family member or friend for safekeeping.  However, a small secure storage area is available on the unit for storage of your valuables if necessary.  Please alert the nurse in the admitting area if you need to use the secure storage area.   While endoscopy is a generally safe procedure, there is a small chance of developing complications that may not be identified up to a week or later after your procedure.  We suggest that you do not plan this procedure within a couple of weeks prior to traveling, or an important social event.        AFTER YOUR PROCEDURE:     A responsible person must pick you up after your procedure to accompany you home, as you will have been sedated and will not be allowed to drive home. You can NOT use public transportation- like bus, [place], or [place].      REMEMBER: You may not drive, work, or engage in important decisions (e.g. financial issues) for the rest of the day after your procedure.     Your specific discharge instructions will be reviewed with you after your procedure.       A copy of the report and results will automatically be sent to your Primary Care Physician and the referring physician.    IF YOUR APPOINTMENT IS IN THE AFTERNOON, YOUR DRIVER MUST BE ON PREMISES BY  4:00PM.     If you have questions about these instructions, you may call us at [phone] between 8:00a.m.-4:30 p.m.  Monday through Friday and we will be glad to answer them for you.      The [facility] has joined hundreds of health care institutions around the nation that have become Smoke-Free throughout their campuses.  We respectfully request that our patients and visitors refrain  from smoking while at the [facility].                        Diabetic Medication Instructions    Oral Medications (pills):   While on clear liquids for your procedure preparation, you will need to decrease the dose of oral diabetic medications in half.   ? For example: If you take Metformin, 2 tablets every morning, you will only take 1 tablet each morning while on clear liquids.    ? For example: If you take Metformin, 2 tablets twice daily, you will only take 1 tablet twice daily while on clear liquids.    *Do not take any diabetic pills the morning of your procedure.*  Insulin:  Short Acting/Rapid Acting Insulin: i.e., Novolin R, Novolog, Humulin R, Humulog, Novorapid.  While on clear liquids, please do not take short acting insulin.  On the day of the procedure, do not take the morning dose of your insulin.   Intermediate/Long Acting Insulin:  Lantus, Levemir, Humulin N, Novolin N.  While on clear liquids, decrease insulin dose by  your normal dose.    ? For example: If you usually take Lantus 30 units each evening, you will reduce this to 15 units for the evening before the procedure (while on clear liquids).    *Do not take your morning dose of insulin before your procedure.*    If you are unsure of how to adjust your diabetic medications, or would like to speak to a nurse regarding this, please contact the GI office at [phone] and we will be happy to assist you.                  Marland Kitchen

## 2018-05-24 ENCOUNTER — Telehealth: Payer: Self-pay

## 2018-05-24 NOTE — Telephone Encounter (Signed)
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 Per referral note pt has worsening kidney function and rising MELD to 18 (no labs seen)Pt with NASH cirrhosis c/b ascites, GIB s/p banding of EV, and HE. Pt has had multiple admissions for HE. Pt's PMH includes ischemic heart disease, MI s/p stents x 2 2018, grade 2 diastolic dysfunction, HTN and DM.    Reviewed with Dr [name] and he will first see [place] in [place] as a consult with updated labs

## 2018-05-25 ENCOUNTER — Telehealth: Payer: Self-pay

## 2018-05-25 ENCOUNTER — Other Ambulatory Visit: Payer: Self-pay

## 2018-05-25 DIAGNOSIS — K7460 Unspecified cirrhosis of liver: Secondary | ICD-10-CM

## 2018-05-25 NOTE — Telephone Encounter (Signed)
Called and LM for pt to call back and confirm upcoming appointment   9/10 NPV in [place] for NASH  Pt needs labs prior to appointment    Please confirm

## 2018-05-26 NOTE — Telephone Encounter (Signed)
Letter and labs mailed 

## 2018-05-26 NOTE — Addendum Note (Signed)
Addended by: Catha NottinghamLEWIS, Makhai Fulco on: 05/26/2018 10:50 AM     Modules accepted: Orders

## 2018-05-31 ENCOUNTER — Telehealth: Payer: Self-pay

## 2018-05-31 NOTE — Telephone Encounter (Signed)
Irving Burton (patient daughter) called to confirm NPV on 06/07/18 with Dr. Alois Cliche in [place]. Patient address changed and Clinical research associate updated. Need to send information to her new address and lab orders.

## 2018-06-01 ENCOUNTER — Inpatient Hospital Stay: Admit: 2018-06-01 | Discharge: 2018-06-01 | Disposition: A | Discharge status: Home / Self Care | Payer: Self-pay

## 2018-06-01 ENCOUNTER — Telehealth: Payer: Self-pay

## 2018-06-01 ENCOUNTER — Encounter: Payer: Self-pay | Admitting: Gastroenterology

## 2018-06-01 NOTE — Telephone Encounter (Signed)
Called Lab/ they will fax over results now.

## 2018-06-01 NOTE — Progress Notes (Signed)
T/C to pt / s/w spouse.  Pt is at hospital at the moment for appt.  Asked to have pt return my call as pt does not have current labs.

## 2018-06-01 NOTE — Telephone Encounter (Signed)
Took call from patient's daughter stating that patient had labs drawn today at [facility].

## 2018-06-02 ENCOUNTER — Encounter: Payer: Self-pay | Admitting: Gastroenterology

## 2018-06-03 ENCOUNTER — Other Ambulatory Visit: Payer: Self-pay | Admitting: Gastroenterology

## 2018-06-03 LAB — CBC AND DIFFERENTIAL
Baso # K/uL: 0.03 10*3/uL
Basophil %: 0.8 %
Eos # K/uL: 0 10*3/uL
Eosinophil %: 4.2 %
Hematocrit: 27.8 %
Hemoglobin: 9.2 g/dL
Lymph # K/uL: 0.73 10*3/uL
Lymphocyte %: 20.4 %
MCV: 90.3 fL
Mono # K/uL: 0.46 10*3/uL
Monocyte %: 12.8 %
Neut # K/uL: 2.2 10*3/uL
Platelets: 99 10*3/uL
RBC: 3.08 MIL/uL
RDW: 19.9 %
Seg Neut %: 61.5 %
WBC: 3.6 10*3/uL

## 2018-06-03 LAB — COMPREHENSIVE METABOLIC PANEL
ALT: 100 U/L
AST: 208 U/L
Albumin: 3 g/dL
Alk Phos: 302 U/L
Anion Gap: 6
Bilirubin,Total: 0.6 mg/dL
CO2: 29 mmol/L
Calcium: 8.7 mg/dL
Chloride: 108 mmol/L
Creatinine: 1.15 mg/dL
GFR,Caucasian: 47 *
Glucose: 181 mg/dL
Lab: 15 mg/dL
Potassium: 3.9 mmol/L
Sodium: 143 mmol/L
Total Protein: 6.2 g/dL

## 2018-06-03 LAB — PROTIME-INR
INR: 1.24
Protime: 14.2 s
aPTT: 31.8 s

## 2018-06-03 NOTE — Telephone Encounter (Signed)
Also sent lab orders and appointment address information to email provided: [email or web address]

## 2018-06-03 NOTE — Telephone Encounter (Signed)
Cheyenne Butler and informed she would not receive information on time if we send to patient now. Verified an email address to send information to her. She informed patient had an ultrasound, CT scan and labs this week at [facility]. Per  [facility] doctor is saying she haves a clot in her vein in liver. Writer called for reports and lab results to be faxed to our office. Not sure if patient will still need to have labs drawn if already drawn couple days ago.

## 2018-06-03 NOTE — Progress Notes (Signed)
Received records from [place] and patient did have labs drawn. Results being entered and imaging reports to be scanned in system.

## 2018-06-06 NOTE — Progress Notes (Signed)
Labs reviewed. Elevated LFTs. Patient to be seen in clinic tomorrow.  MELD-Na score: 10 at 06/02/2018  6:51 AM  MELD score: 10 at 06/02/2018  6:51 AM  Calculated from:  Serum Creatinine: 1.15 mg/dL at 06/02/7095  2:83 AM  Serum Sodium: 143 mmol/L (Rounded to 137) at 06/02/2018  6:51 AM  Total Bilirubin: 0.6 mg/dL (Rounded to 1) at 03/03/2946  6:51 AM  INR(ratio): 1.24 at 06/01/2018 10:25 PM  Age: 72 years

## 2018-06-07 ENCOUNTER — Ambulatory Visit: Payer: Medicare Other | Attending: Gastroenterology | Admitting: Gastroenterology

## 2018-06-07 ENCOUNTER — Encounter: Payer: Self-pay | Admitting: Gastroenterology

## 2018-06-07 VITALS — BP 129/70 | HR 59 | Temp 97.2°F | Resp 16 | Ht 62.0 in | Wt 158.6 lb

## 2018-06-07 DIAGNOSIS — K7460 Unspecified cirrhosis of liver: Secondary | ICD-10-CM

## 2018-06-15 ENCOUNTER — Telehealth: Payer: Self-pay | Admitting: Gastroenterology

## 2018-06-15 ENCOUNTER — Other Ambulatory Visit: Payer: Self-pay | Admitting: Transplant

## 2018-06-15 ENCOUNTER — Telehealth: Payer: Self-pay

## 2018-06-15 MED ORDER — ALBUMIN HUMAN 25 % IV SOLN (250 MG/ML) *I*
50.0000 g | INTRAVENOUS | 3 refills | Status: DC
Start: 2018-06-15 — End: 2018-06-15

## 2018-06-15 MED ORDER — ALBUMIN HUMAN 25 % IV SOLN (250 MG/ML) *I*
50.0000 g | INTRAVENOUS | 3 refills | Status: AC
Start: 2018-06-15 — End: ?

## 2018-06-15 NOTE — Telephone Encounter (Signed)
Peggy with the office of Dr. Consuello MasseNwosu, is requesting office notes or records from the patient's visit on 05/19/18, with Dr. Octavio GravesWerth. Reason for record is continue of care. She would like the information to be faxed to [phone] ATTN Dr. Consuello MasseNwosu. If necessary, Gigi Gineggy can be reached at [phone].

## 2018-06-15 NOTE — Telephone Encounter (Signed)
Peggy with the office of Dr. Nwosu, is requesting office notes or records from the patient's visit on 05/19/18, with Dr. Werth. Reason for record is continue of care. She would like the information to be faxed to [phone] ATTN Dr. Nwosu. If necessary, Peggy can be reached at [phone].    

## 2018-06-15 NOTE — Telephone Encounter (Signed)
Patient daughter Irving Burtonmily called regarding appointments for liver txp evaluation. They were told at last office visit on Tuesday 9/10 that patient would come for full eval. Also she need [facility]. Per visit she is to continue infusions. Call back # [phone]. Office note not completed.

## 2018-06-15 NOTE — Telephone Encounter (Signed)
Gave referral to Tina.

## 2018-06-15 NOTE — Telephone Encounter (Signed)
Cheyenne Butler who is supposed to schedule the albumin's hepa or transplant.

## 2018-06-15 NOTE — Telephone Encounter (Signed)
-----   Message from Caro Hightamara P Olsen, RN sent at 06/13/2018 11:09 AM EDT -----      ----- Message -----  From: Corky CraftsGrizzanti, Michael N, NP  Sent: 06/10/2018   5:00 PM  To: Logan BoresJennie M Errigo, RN, Caro Hightamara P Olsen, RN    Please refer for transplant evaluation. [facility]. THanks

## 2018-06-15 NOTE — Telephone Encounter (Signed)
Faxed over the requested infor to Dr. Consuello MasseNwosu [phone])

## 2018-06-16 ENCOUNTER — Encounter: Payer: Self-pay | Admitting: Gastroenterology

## 2018-06-16 NOTE — Telephone Encounter (Addendum)
[employer] [place] and was told to call Dr. Consuello MasseNwosu office [phone] for continued infusions, spoke with Dr. Consuello MasseNwosu and she asked me to fax her the infusion [id number] and she would [facility] the [facility].

## 2018-06-19 ENCOUNTER — Encounter: Payer: Self-pay | Admitting: Gastroenterology

## 2018-06-19 NOTE — H&P (Signed)
[facility] VISIT    Referring Physician:  Primary Care Physician:    Cheyenne Sic, FNP    Primary Gastroenterologist:    Maris Berger, NP  [address], [place] [address]     History of Present Illness:   We had the pleasure of seeing  Cheyenne Butler in the Transplant [facility] as a new patient referral.     As you know, she is a 72 y.o. female with history of ETOH cirrhosis however patient denies heavy ETOH use in the past. Complicated by ascites, GIB with banding of esophageal varices, and hepatic encephalopathy requiring multiple admission. Her cirrhosis was first diagnosed a few years ago.     She  reports  fatigue. Denies jaundice, icterus pruritis.  Reports abdominal distention or ascites. Reports intermittent lower extremity edema. She is  following a low sodium diet. Denies abdominal pain. Denies nausea, vomiting, fever, chills. Denies easy bruising, GI bleeding. No hematemesis. Denies melena or hematochezia. Intermittent confusion. Moving bowels 3-4 BMs daily.  Has had 4+ admissions for HE, UTI, ? Non-occlusive PvT. CT on 9/4 no clot.    Employment: Disabled.    Alcohol Use: Denies.    [name] denies any further questions or concerns at this time.    Allergies   Allergen Reactions    Sulfa Antibiotics Hives        Prior to Admission medications    Medication Sig Start Date End Date Taking? Authorizing Provider   insulin glargine (LANTUS) 100 UNIT/ML injection vial Inject 45 Units into the skin nightly      Yes [provider]   oxybutynin (DITROPAN) 5 MG tablet Take 5 mg by mouth 3 times daily   Yes [provider]   pantoprazole (PROTONIX) 40 MG EC tablet Take 40 mg by mouth 2 times daily   Swallow whole. Do not crush, break, or chew.    Yes [provider]   gabapentin (NEURONTIN) 400 MG capsule Take 400 mg by mouth nightly      Yes [provider]   metoprolol (TOPROL-XL) 25 MG 24 hr  tablet Take 50 mg by mouth 2 times daily   Do not crush or chew. May be divided.    Yes [provider]   atorvastatin (LIPITOR) 40 MG tablet Take 40 mg by mouth daily   Yes [provider]   spironolactone (ALDACTONE) 50 MG tablet Take 50 mg by mouth daily      Yes [provider]   rifAXIMin (XIFAXAN) 550 MG tablet Take 550 mg by mouth 2 times daily   Yes [provider]   Albumin human 25% 25 % IV Administer 200 mLs (50 g total) into the vein once a week   To obtain a serum albumin level of 3.5 for 2 consecutive weeks then discontinue. 06/15/18   Jamelle Haring, NP   nitroglycerin (NITROSTAT) 0.4 MG SL tablet Place 0.4 mg under the tongue every 5 minutes as needed for Chest pain   May repeat 2 times then call 911 if pain persists.    [provider]        Past Medical History:   Diagnosis Date    Angina pectoris     Bladder prolapse, female, acquired     CAD (coronary artery disease)     Jan 2018 - stents placed (2)    Cirrhosis of liver with ascites, unspecified hepatic cirrhosis  type     DM (diabetes mellitus), type 2     Dyslipidemia     Esophageal reflux     Grade II diastolic dysfunction     Hemorrhoid     History of blood transfusion     Hypertension     Ischemic heart disease     Urinary incontinence         Past Surgical History:   Procedure Laterality Date    CARDIAC CATHETERIZATION  10/2010    CORONARY ANGIOPLASTY WITH STENT PLACEMENT  09/2016    TUBAL LIGATION      variceal banding         Social History     Social History    Marital status: Married     Spouse name: N/A    Number of children: N/A    Years of education: N/A     Occupational History    Not on file.     Social History Main Topics    Smoking status: Never Smoker    Smokeless tobacco: Never Used    Alcohol use No    Drug use: No    Sexual activity: Not on file     Social History Narrative    No narrative on file         Family History   Problem Relation Age of  Onset    Colon cancer Brother     Liver Disease Neg Hx         Review of Systems   Constitutional: Positive for malaise/fatigue.   HENT: Negative for nosebleeds.    Eyes: Negative.    Respiratory: Negative.    Cardiovascular: Positive for leg swelling.   Gastrointestinal: Negative.    Genitourinary: Negative.    Musculoskeletal: Positive for falls.   Neurological: Positive for weakness.        Intermittent encephalopathy   Endo/Heme/Allergies: Bruises/bleeds easily.   Psychiatric/Behavioral: Positive for depression. The patient is nervous/anxious.         Vitals:    06/07/18 1000   BP: 129/70   Pulse: 59   Resp: 16   Temp: 36.2 C (97.2 F)   Weight: 71.9 kg (158 lb 9.6 oz)   Height: 1.575 m (5' 2" )      Vitals reviewed at time of encounter.    Body mass index is 29.01 kg/m.    Physical Exam   Constitutional: She is oriented to person, place, and time and well-developed, well-nourished, and in no distress. No distress.   HENT:   Mouth/Throat: Oropharynx is clear and moist.   No Templar muscle wasting.   Eyes: No scleral icterus.   Abdominal: Soft. There is no tenderness.   No ascites, or fluid wave.  Umbilical hernia, small, reducible, easily re protrudes.   Genitourinary:   Genitourinary Comments: Deferred   Musculoskeletal: She exhibits no edema.   No anatomical snuff box muscle wasting.   Lymphadenopathy:     She has no cervical adenopathy.   Neurological: She is alert and oriented to person, place, and time.   No asterexis   Skin: Skin is warm. She is not diaphoretic.   Psychiatric: Mood, memory, affect and judgment normal.             LAB DATA:  Laboratory Data  Lab Results   Component Value Date    NA 143 06/02/2018    K 3.9 06/02/2018    CL 108 06/02/2018    CO2 29 06/02/2018    UN 15  06/02/2018    CREAT 1.15 06/02/2018     Lab Results   Component Value Date    ALK 302 06/02/2018    TB 0.6 06/02/2018    ALB 3.0 06/02/2018    ALT 100 06/02/2018    AST 208 06/02/2018     Lab Results   Component Value Date     WBC 3.6 06/02/2018    HGB 9.2 06/02/2018    HCT 27.80 06/02/2018    MCV 90.3 06/02/2018    PLT 99 06/02/2018     No results found for: LIP, AMY   Lab Results   Component Value Date    INR 1.24 06/01/2018     Abdominal IMAGING:    Recent imaging findings reviewed - please see scanned document for details         Assessment and Plan:     1. Cirrhosis:   -Etiology secondary to ETOH  -MELD-Na score: 10 at 06/02/2018  6:51 AM  MELD score: 10 at 06/02/2018  6:51 AM  Calculated from:  Serum Creatinine: 1.15 mg/dL at 06/02/2018  6:51 AM  Serum Sodium: 143 mmol/L (Rounded to 137) at 06/02/2018  6:51 AM  Total Bilirubin: 0.6 mg/dL (Rounded to 1) at 06/02/2018  6:51 AM  INR(ratio): 1.24 at 06/01/2018 10:25 PM  Age: 66 years   -Hx of HCC: no    2. Ascites/Edema:  Controlled. With diuretics and albumin. On Aldactone 41m daily, d/t labile renal function and inability to tolerate lasix she was started on albumin infusions a couple of weeks ago. These should continue until serum albumin is greater than 3.5 x 2 draws. Recommend albumin 25% 50g weekly, with weekly labs.      In patients with diuretic-sensitive ascites, the removal of 5 liters of fluid is sufficient to reduce intraabdominal pressure, at which point sodium restriction and diuretics are continued   6 to 8 g of albumin should be given per liter of fluid removed in the case of larger-volume paracentesis, >5L   Home regimen includes    We advised 2g Na restriction, daily weight    3. Hepatic Encephalopathy: On lactulose and xifaxin. Hopefully with more renal protection may see reduction in episodes. Reinforced to titrate her dose based on frequency of bowel movements. Should not be having more than 3-4 BMs daily    4. Transplant Candidacy:   Will refer for transplant evaluation. Hx of cardiac disease stenting x 2 which will need to be further assessed.     Request for outside records to include: none.    Referral for Transplant Evaluation required: yes    5. Follow Up:     Return  to Clinic for liver transplant evaluation.    Please see below general recommendations made in the care of patients with cirrhosis of the liver.    ROUTINE CARE OF THE CIRRHOTIC PATIENT to be managed by their local Gastroenterologist:     -ALCOHOL CESSATION/ABSTINENCE: chronic liver disease is accelerated by alcohol consumption even if it is not the main cause of the liver injury    -HEPATOCELLULAR CARCINOMA SCREENING: twice yearly U/S and AFP    -VARICEAL SCREENING/SURVEILLANCE: at least every 3 years    General Recommendations:  Baveno VI consensus guide-lines regarding portal hypertension recommend the discontinuation of beta-blockers when the systolic blood pressure is less than 90 mm Hg, the serum sodium concentration is less than 120 mmol per liter, or acute kidney injury has developed.    -VACCINATION: HAV/HBV/pneumococcal/yearly influenza vaccine    -  BONE HEALTH: bone density every 3 years with aggressive management of bone loss if present; measurement of vitamin D level twice yearly and repletion if abnormal.    -NUTRITIONAL THERAPY: a high protein (1.6g/kg) low sodium (Na<2g/day). Patients with encephalopathy benefit from a protein snack in the evening to reduce overnight muscle catabolism    -MEDICATIONS TO AVOID:  Benzos, NSAIDs, sedating medications  In patients with ascites, avoid non-dipyridamole CCB and ace inhibitors as they worsen fluid retention  Opioids should be used only when absolutely necessary, at lower dose and increased interval    Acetaminophen < 2g/day is safe in cirrhotic patients who do not consume alcohol.         The patient was seen in [facility] with Dr. Lara Mulch, MD.  Thank you for allowing [place] to participate in this patient's care.        Attending Attestation:       I saw, examined and evaluated the patient, reviewed the patient chart and personally reviewed their relevant images and lab findings. On my exam I find....    S: Cheyenne Butler is a 72 y.o.  female with laennec's cirrhosis. They are decompensated.  No other significant or new complaints.    O: Alert and Oriented x3              Vital signs reviewed and stable              An Icteric sclera              Abdomen:   Soft, NT. No free fluid              Extremities: No pitting edema in both lower extremities              CNS: Non focal and no asterixis    Recent Labs revealed:Low MELD  Recent Imaging: demonstrated possible portal vein thrombosis in evolution    MELD-Na score: 10 at 06/02/2018  6:51 AM  MELD score: 10 at 06/02/2018  6:51 AM  Calculated from:  Serum Creatinine: 1.15 mg/dL at 06/02/2018  6:51 AM  Serum Sodium: 143 mmol/L (Rounded to 137) at 06/02/2018  6:51 AM  Total Bilirubin: 0.6 mg/dL (Rounded to 1) at 06/02/2018  6:51 AM  INR(ratio): 1.24 at 06/01/2018 10:25 PM  Age: 4 years     Plan: As per above. And in summary; Although she has a low MELD she may benefit from an OLT evaluation knowing that it would at least require a live donor if the group feels she can benefit. Her cardiac status is of primary concern.    Elvin So, MD   Medical Director: [facility]  [facility]

## 2018-06-23 ENCOUNTER — Telehealth: Payer: Self-pay

## 2018-06-23 DIAGNOSIS — K7460 Unspecified cirrhosis of liver: Secondary | ICD-10-CM

## 2018-06-23 NOTE — Telephone Encounter (Signed)
UNABLE TO CONFIRM 10/3 AT 915 DUE TO EITHER INVALID PHONE/LINE BUSY/FULL VM/ NON-WORKING NUMBER

## 2018-06-23 NOTE — Telephone Encounter (Signed)
Please call [facility] and request report of abdomina CT scan done 05/12/18

## 2018-06-23 NOTE — Telephone Encounter (Signed)
Received call back from pt's daughter Irving Burton . Discussed that I needed permission form pt for me to be able to speak with her as they are new to our dept and each dept needs to obtain this permission.  She gave me pt's home phone to be  [phone] (not listed)  Cell phone [phone] (currently listed as home phone) and the phone listed under other does not work.  Will have phone numbers updated. Called pt's home phone to get permission to speak with emily and pt currently in bathroom-spoke to SIL.  He will have pt call me when she is able.  Contact info given

## 2018-06-23 NOTE — Telephone Encounter (Signed)
Pt wants to know if they can use hotel for endoscopy appt. When they come Tuesday as well?  I was advised to ask you please call pt back and advise

## 2018-06-23 NOTE — Telephone Encounter (Signed)
CONFIRMED.

## 2018-06-23 NOTE — Telephone Encounter (Signed)
Cheyenne Butler is 72 y.o.female with end stage liver disease related to unclear etiology.   MELD 10.  Patient has a past medical history of   Past Medical History:   Diagnosis Date    Angina pectoris     Bladder prolapse, female, acquired     CAD (coronary artery disease)     Jan 2018 - stents placed (2)    Cirrhosis of liver with ascites, unspecified hepatic cirrhosis type     DM (diabetes mellitus), type 2     Dyslipidemia     Esophageal reflux     Grade II diastolic dysfunction     Hemorrhoid     History of blood transfusion     Hypertension     Ischemic heart disease     Urinary incontinence     and a past surgical history to include   Past Surgical History:   Procedure Laterality Date    CARDIAC CATHETERIZATION  10/2010    CORONARY ANGIOPLASTY WITH STENT PLACEMENT  09/2016    TUBAL LIGATION      variceal banding     . Cheyenne Butler's symptoms of liver disease include ascites,fatigue,EV bleed with banding, and HE.      Cheyenne Butler has been referred to [place] by Dr Alois Cliche and Dorathy Daft NP      Functional Status    Liver Karnofsky Score:  60 - Remains on medications, now has symptom breakthrough of liver disease    Chemical dependency/social history: Pt denies any hx of heavy alcohol use and has not had alcohol in many years.  Describes herself as an oocassional drinker in the past. Illicit drug history:denies Smoking history:never. Patient has attended a chemical dependency program: N/A. Reviewed with pt no alcohol and no drugs not prescribed by your physician if you want to be considered for liver transplant listing.  Random urine tox screens are done on all of our patients. No      Cheyenne Butler has been followed by a mental health care provider in the past No       Social support/issues:Family living in home and Family living locally. Daughter will be attending teaching class and SW evaluation with the patient. Not Employed     Infection history:  Do you have a history of infections  that required prolonged IV antibiotics?No           Health Maintenance:    Last Dental Exam: full dentures     If you need to be referred to a dentist, we can refer you to our hospital dentistry group on [facility]. They also have financial coordinators that can help you apply for financial assistance. Are you interested in [place] referring you?  No    Last Pap:  40 years.   Last Mammogram: unsure if within last year.   Last Colonoscopy:  03/24/18.   Last Endoscopy:  Scheduled 06/30/18 with Dr Octavio Graves.      Pt instructed to make appointments for any of the above health maintenance items that need completion, and to bring appointment information to their evaluation appointment so we can follow up and obtain results. Appointment information includes date of appointment, and name of doctor the appointment is with. Form will be sent with appointment information for htem to fill out and bring back.  No        Frailty:    Fatigue: How much of the time during the past 4 weeks did you feel tired?   1=All of the time  (  1)  2=Most of the time (1)  3=Some of the time (0)  4=A little of the time (0)-X  5=None of the time (0)    Score 0    Resistance: By yourself, and not using aids, do  you have difficulty walking up 10 steps without resting?    No    1=Yes  0=No    Score 1    Ambulation: By yourself , and not using aids, do you have any difficulty walking several hundred yards?   Yes  1=Yes  0=No    Score 1    Illness:  Did a doctor ever tell you that you have any of the following illnesses?  HTN yes  Diabetes yes  Cancer (other than a minor skin cancer) no  Chronic lung disease no  Heart attack no  CHF no  Angina yes  Asthma no  Arthritis no  Stroke no  Kidney Disease no    0-4=0  5-11=1    Score 0    Loss of weight:   Current weight 146  Weight one year ago 169  Percent weight change: (weight one year ago - current weight)/weight one year ago -then multiply by 100     14  Percent change >5= 1 (represents a 5% weight loss)   Percent change  <5=0    Score 1      Frailty Scale:  0:robust  1-2: pre-frail  3-5 : frail    Score 3  If frail-PT referral with evaluation           Reviewed with patient chemical dependency guidelines-no alcohol and no drugs not prescribed by your physician  Reviewed with patient HIV testing and verbal consent.  Advised patient to have medication list with them at time of evaluation.  Patient verbalized understanding: Yes    Due to documented lung disease history, with need for home O2 or inhalers, informed patient for the need of pulmonary function tests: N/A    Due to alcohol/drug or mental health history, informed patient of the need for psychiatric consult: N/A    Due to history of recurrent infections, ,informed patient of the need for infectious disease consult: N/A    Patient scheduling per protocol.  This patient is a regular track.  Records returned to assistant on 06/24/18.      1. Full eval  2. Tue/wed same week  3. Please call daughter Cheyenne Butler with all appt information on cell phone [phone]  4.PLease remind her when appt is scheduled that pt will have to call and change her albumin infusion that week from Wednesday to Thursday  5. Pt will need to see PT  6. Pt will need a Cardiology consult-if possible schedule cardiac testing on Tuesday and have pt see Dr Rachael Darby on Wed afternoon-if not we can schedule cardiology consult the following week. Consult is for hx of grade 2 diastolic dysfunction and hx of ischemic heart disease.   7. Awaiting report of CT abd done to see if pt needs additional abdominal testing

## 2018-06-23 NOTE — Telephone Encounter (Signed)
LVMM on home number to return my call to Dr Laureen Ochs office . Will await call back.  Other number listed would not accept my call.

## 2018-06-24 NOTE — Telephone Encounter (Signed)
Received CT report and gave to [facility].

## 2018-06-24 NOTE — Telephone Encounter (Signed)
Returned call to patient.

## 2018-06-24 NOTE — Telephone Encounter (Signed)
[facility] and they fax over report of CT done 06/01/18. Spoke to Broussard in CD room and she will send out CD.

## 2018-06-24 NOTE — Telephone Encounter (Signed)
Done

## 2018-06-27 NOTE — Telephone Encounter (Signed)
Received CD and brought it to the CD room.

## 2018-06-28 ENCOUNTER — Other Ambulatory Visit: Payer: Self-pay

## 2018-06-28 ENCOUNTER — Other Ambulatory Visit: Payer: Self-pay | Admitting: Gastroenterology

## 2018-06-30 ENCOUNTER — Telehealth: Payer: Self-pay

## 2018-06-30 ENCOUNTER — Encounter: Payer: Self-pay | Admitting: Gastroenterology

## 2018-06-30 ENCOUNTER — Ambulatory Visit
Admission: RE | Admit: 2018-06-30 | Discharge: 2018-06-30 | Disposition: A | Discharge status: Home / Self Care | Payer: Medicare Other | Source: Ambulatory Visit | Attending: Gastroenterology | Admitting: Gastroenterology

## 2018-06-30 DIAGNOSIS — K7460 Unspecified cirrhosis of liver: Secondary | ICD-10-CM | POA: Insufficient documentation

## 2018-06-30 DIAGNOSIS — I8510 Secondary esophageal varices without bleeding: Secondary | ICD-10-CM | POA: Insufficient documentation

## 2018-06-30 DIAGNOSIS — K3189 Other diseases of stomach and duodenum: Secondary | ICD-10-CM

## 2018-06-30 DIAGNOSIS — Z800 Family history of malignant neoplasm of digestive organs: Secondary | ICD-10-CM | POA: Insufficient documentation

## 2018-06-30 LAB — POCT GLUCOSE: Glucose POCT: 109 mg/dL — ABNORMAL HIGH (ref 60–99)

## 2018-06-30 MED ORDER — MIDAZOLAM HCL 1 MG/ML IJ SOLN *I* WRAPPED
INTRAMUSCULAR | Status: AC | PRN
Start: 2018-06-30 — End: 2018-06-30
  Administered 2018-06-30: 2 mg via INTRAVENOUS

## 2018-06-30 MED ORDER — MIDAZOLAM HCL 1 MG/ML IJ SOLN *I* WRAPPED
INTRAMUSCULAR | Status: AC
Start: 2018-06-30 — End: 2018-06-30
  Filled 2018-06-30: qty 10

## 2018-06-30 MED ORDER — FENTANYL CITRATE 50 MCG/ML IJ SOLN *WRAPPED*
INTRAMUSCULAR | Status: AC | PRN
Start: 2018-06-30 — End: 2018-06-30
  Administered 2018-06-30: 75 ug via INTRAVENOUS

## 2018-06-30 MED ORDER — FENTANYL CITRATE 50 MCG/ML IJ SOLN *WRAPPED*
INTRAMUSCULAR | Status: AC
Start: 2018-06-30 — End: 2018-06-30
  Filled 2018-06-30: qty 4

## 2018-06-30 MED ORDER — LACTATED RINGERS IV SOLN *I*
100.0000 mL/h | INTRAVENOUS | Status: DC
Start: 2018-06-30 — End: 2018-07-01
  Administered 2018-06-30: 100 mL/h via INTRAVENOUS

## 2018-06-30 MED ORDER — FENTANYL CITRATE 50 MCG/ML IJ SOLN *WRAPPED*
INTRAMUSCULAR | Status: AC | PRN
Start: 2018-06-30 — End: 2018-06-30
  Administered 2018-06-30: 25 ug via INTRAVENOUS

## 2018-06-30 NOTE — Preop H&P (Signed)
OUTPATIENT PRE-PROCEDURE H&P    Chief Complaint / Indications for Procedure: varices surveillance    Past Medical History:   Past Medical History:   Diagnosis Date    Angina pectoris     Bladder prolapse, female, acquired     CAD (coronary artery disease)     Jan 2018 - stents placed (2)    Cirrhosis of liver with ascites, unspecified hepatic cirrhosis type     DM (diabetes mellitus), type 2     Dyslipidemia     Esophageal reflux     Grade II diastolic dysfunction     Hemorrhoid     History of blood transfusion     Hypertension     Ischemic heart disease     Urinary incontinence      Past Surgical History:   Procedure Laterality Date    CARDIAC CATHETERIZATION  10/2010    CORONARY ANGIOPLASTY WITH STENT PLACEMENT  09/2016    TUBAL LIGATION      variceal banding       Family History   Problem Relation Age of Onset    Colon cancer Brother     Liver Disease Neg Hx      Social History     Social History    Marital status: Married     Spouse name: N/A    Number of children: N/A    Years of education: N/A     Social History Main Topics    Smoking status: Never Smoker    Smokeless tobacco: Never Used    Alcohol use No    Drug use: No    Sexual activity: Not Asked     Other Topics Concern    None     Social History Narrative    None       Allergies:    Allergies   Allergen Reactions    Sulfa Antibiotics Hives       Medications:  Current Outpatient Prescriptions   Medication    Albumin human 25% 25 % IV    pantoprazole (PROTONIX) 40 MG EC tablet    gabapentin (NEURONTIN) 400 MG capsule    metoprolol (TOPROL-XL) 25 MG 24 hr tablet    atorvastatin (LIPITOR) 40 MG tablet    spironolactone (ALDACTONE) 50 MG tablet    rifAXIMin (XIFAXAN) 550 MG tablet    insulin glargine (LANTUS) 100 UNIT/ML injection vial    oxybutynin (DITROPAN) 5 MG tablet    nitroglycerin (NITROSTAT) 0.4 MG SL tablet     Current Facility-Administered Medications   Medication Dose Route Frequency    Lactated Ringers  Infusion  100 mL/hr Intravenous Continuous      Vitals:    06/30/18 0919   BP: 133/62   Pulse: 63   Resp: 16   Temp: 36.1 C (97 F)   Weight: 65.8 kg (145 lb)   Height: 157.5 cm (5\' 2" )         Physical Examination:General: alert, oriented times three, no apparent distress, appearing age appropriate  Oropharynx: normal  Lungs: good diaphragmatic excursion, lungs clear to auscultation bilaterally  Heart: regular rate and rhythm  Abdomen: abdomen soft, normal active bowel sounds  Neuro: grossly unremarkable    Currently Active Problems:  There is no problem list on file for this patient.         A:  Varices surveillance   P:  EGD with moderate sedation    UPDATES TO PATIENT'S CONDITION on the DAY OF SURGERY/PROCEDURE    I.  Updates to Patient's Condition (to be completed by a provider privileged to complete a H&P, following reassessment of the patient by the provider):    Full H&P done today; no updates needed.    II. Procedure Readiness   I have reviewed the patient's H&P and updated condition. By completing and signing this form, I attest that this patient is ready for surgery/procedure.  The risks, benefits and alternatives to the procedure have been discussed and questions answered.      III. Attestation   I have reviewed the updated information regarding the patient's condition and it is appropriate to proceed with the planned surgery/procedure.    Darryl Lent, MD as of 9:36 AM 06/30/2018

## 2018-06-30 NOTE — Telephone Encounter (Signed)
Repeat Egd needed in 1 yr. ([facility]'s patient)

## 2018-06-30 NOTE — Discharge Instructions (Signed)
GASTROENTEROLOGY UNIT  DISCHARGE INSTRUCTIONS FOR GASTROSCOPY      06/30/2018                10:00 AM    EGD     Do not drive, operate heavy machinery, drink alcoholic beverages, make important personal or business decisions, or sign legal documents until the next day.    Return to your usual medications   Return to your usual diet    Things You May Expect:   A mild sore throat (Warm liquids or lozenges will soothe the throat)   You were given medication to help you relax during the test. You need to rest at home for at least 4-6 hours.    You Should Call Your Doctor For Any of the Following:   Fever, abdominal or chest pain that does not go away   Cough or trouble breathing   Bloody or black stools.   Pain or redness at the IV site    If you have a serious problem after hours.  Call [phone] to reach the GI physician on call.    If you are unable to reach your doctor, go to the [facility] Emergency Department.    Follow Up Care:   Report will be sent to your primary doctor.   Repeat exam in  1 year(s).    New Prescriptions    No medications on file

## 2018-06-30 NOTE — Procedures (Signed)
Procedure Report    06/30/2018  Cheyenne Butler  [id number]    Procedure: EGD  Pre op Diagnosis:    Esophageal varices, portal hypertensive gastropathy, cirrhosis  Post op Diagnosis:   same    Risks, benefits, alternatives discussed prior to consent.  Preprocedure pause and patient identification performed ("time-out") in the procedure room before starting.    EBL 0 ml    Medications used for sedation:  Versed 4 mg IV Fentanyl 100 mcg IV  I personally ordered and supervised administration of all moderate sedation medications.    Upper endoscopy carried out to the second portion of the duodenum. The entire upper GI tract was well visualized, including retroflexed views of the incisura, cardia and fundus.    Findings:    The esophagus was abnormal. Evidence of small esophageal varices that completely flattened with insufflation, and not stigmata, as well as some scar evidence of previous banding, seen in distal esophagus.    In the esophagus also, no masses, no mucosal abnormalities, no overt Barrett's esophagus. The gastroesophageal junction was located 32 cm from the incisors.    The stomach was abnormal. Non bleeding portal hypertensive gastropathy is present. No varices. No masses, no ulcers, no erosions, no other vascular abnormalities. The rugal folds were normal. The pylorus was patent.     The duodenum was normal. No ulcers or strictures. No masses. Villi were seen.    Impression:  Small esophageal varices that did not require further banding. Portal hypertensive gastropathy    Recommendation:    Repeat EGD in one year      Wilford Grist, MD  Associate Professor of Clinical Medicine  Attending Physician, Division of Gastroenterology and Hepatology, [facility]    Moderate Sedation Face Times  Start Time: 0944  End Time: 0952  Duration (minutes): 8 Minutes

## 2018-07-01 ENCOUNTER — Other Ambulatory Visit: Payer: Self-pay

## 2018-07-01 ENCOUNTER — Telehealth: Payer: Self-pay

## 2018-07-01 DIAGNOSIS — I259 Chronic ischemic heart disease, unspecified: Secondary | ICD-10-CM

## 2018-07-01 DIAGNOSIS — I5189 Other ill-defined heart diseases: Secondary | ICD-10-CM

## 2018-07-01 DIAGNOSIS — K7210 Chronic hepatic failure without coma: Secondary | ICD-10-CM

## 2018-07-01 NOTE — Telephone Encounter (Signed)
Patient scheduled for the following liver transplant evaluation appointments:    On 10-29: arrive @ 8:00, class @ 9:00, labs @ 10:00, clinic @ 10:45, DSE @ 2:15, CT @ 4:10 at [address]    On 10-30: H&P @ 9:00, surgeon @ 10:00, GI @ 10:30, PT @ 2:00, cardiology @ 3:20 at [facility].    Called patient and spoke to her daughter Maralyn Sago to confirm all appointments. Reminded her to move patient's albumin infusion to Thurs that week. Mailed letter.

## 2018-07-21 NOTE — Progress Notes (Signed)
Potential Liver Transplant Recipient Nutrition Evaluation      ATTRIBUTES   Vital Statistics   Height/Weight Vitals 07/26/2018   HEIGHT(METRIC) 158 cm   HEIGHT(STANDARD) _0    Height Method    WEIGHT(METRIC) 62.596 kg   WEIGHT(STANDARD) 138 lbs   Weight Method    BMI 25.3 kg/m2      Vitals 06/30/2018   HEIGHT(METRIC) 158 cm   HEIGHT(STANDARD) _1    Height Method Stated   WEIGHT(METRIC) 65.772 kg   WEIGHT(STANDARD) 145 lbs   Weight Method Stated   BMI 26.6 kg/m2       EDW   130 lb ( 59 kg)   BMI on EDW 23.7   Pre-illness Weight 169 lb (76.8 kg) one year ago with fluid; pre-illness wt 200 lb several years ago      The following criteria for the diagnosis of malnutrition have been recommended in a consensus statement from the Academy of Nutrition and Dietetics and the American Society of Parenteral and Enteral Nutrition (ASPEN):  Weight loss based on estimated dry weight   _2 >7.5% in 3 months, or                                                    _3 Yes    _4 >10% in 6 months or,  _5 >20% in one year         Recent labs reviewed    _6  Yes   _7  No  Results for ARVADA, SEABORN (MRN [id number]) as of 07/21/2018 10:45   Ref. Range 06/02/2018 06:51   Sodium Latest Units: mmol/L 143   Potassium Latest Units: mmol/L 3.9   Chloride Latest Units: mmol/L 108   CO2 Latest Units: mmol/L 29   Anion Gap Unknown 6   UN Latest Units: mg/dL 15   Creatinine Latest Units: mg/dL 1.15   GFR,Black Latest Units: * Pend   GFR,Caucasian Latest Units: * 47   Glucose Latest Units: mg/dL 181   Calcium Latest Units: mg/dL 8.7   Total Protein Latest Units: g/dL 6.2   Albumin Latest Units: g/dL 3.0   ALT Latest Units: U/L 100   AST Latest Units: U/L 208   Alk Phos Latest Units: U/L 302   Bilirubin,Total Latest Units: mg/dL 0.6      Current Medications   Current Outpatient Medications:     Albumin human 25% 25 % IV, Administer 200 mLs (50 g total) into the vein once a week   To obtain a serum albumin level of 3.5 for 2 consecutive weeks then  discontinue., Disp: 50 mL, Rfl: 3    insulin glargine (LANTUS) 100 UNIT/ML injection vial, Inject 45 Units into the skin nightly   , Disp: , Rfl:     oxybutynin (DITROPAN) 5 MG tablet, Take 5 mg by mouth 3 times daily, Disp: , Rfl:     pantoprazole (PROTONIX) 40 MG EC tablet, Take 40 mg by mouth 2 times daily   Swallow whole. Do not crush, break, or chew. , Disp: , Rfl:     gabapentin (NEURONTIN) 400 MG capsule, Take 400 mg by mouth nightly   , Disp: , Rfl:     metoprolol (TOPROL-XL) 25 MG 24 hr tablet, Take 50 mg by mouth 2 times daily   Do not crush or chew. May be divided. 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   MELD 10.  Patient has a past medical history of   Past Medical History:   Diagnosis Date    Angina pectoris     Bladder prolapse, female, acquired     CAD (coronary artery disease)     Jan 2018 - stents placed (2)    Cirrhosis of liver with ascites, unspecified hepatic cirrhosis type     DM (diabetes mellitus), type 2     Dyslipidemia     Esophageal reflux     Grade II diastolic dysfunction     Hemorrhoid     History of blood transfusion     Hypertension     Ischemic heart disease     Urinary incontinence     and a past surgical history to include         Past Surgical History:   Procedure Laterality Date    CARDIAC CATHETERIZATION  10/2010    CORONARY ANGIOPLASTY WITH STENT PLACEMENT  09/2016    TUBAL LIGATION      variceal banding      . Catelynn Nadal's symptoms of liver disease include ascites,fatigue,EV bleed with banding, and HE.         Regimen/Education History   Current Regimen No salt; no sugar forDM   Diet education/printed materials provided by   _0  Yes   _1  No   Adherence  _2  Yes   _3  No   Comments Mostly avoids salty foods; no cooking with salt   Estimated Nutritional Needs   kcal 1250-1500 (25-30 IBW)   gm protein 75 (1.5)   ml fluid 1500   Nutrition/Medical History   Swallowing difficulties/varices/GIB     _4  Yes   _5  No   Mouth sores, ulcers, bleeding gums    _6  Yes   _7  No   Symptoms of micronutrient deficiencies     _8  Yes   _9  No   Vitamins/Minerals   _10   Yes   _11  No  Vit C 500 mg   Herbal remedies   _12  Yes   _13  No   N/V   _14  Yes   _15  No   Early Satiety   _16  Yes   _17  No   Ascities _18  None   _19  Mild   _20  Moderate   _21  Severe   Edema _22  None   _23 1+  _24  2+   _25  3+or >   Encephalopathy _26  Yes     _27  No  sometimes   Food allergies/intolerances _28  Yes     _29  No   Living situation Lives w/  Husband, 3  Daughters adults currently       Appetite   Decreased _30  Yes   _31  No     Po intake good       Skin   Sores, ulcers   _32   Yes   _0  No   Jaundice   _1  Yes   _2  No   Bowel Activity   Diarrhea    _3  Yes   _4  No    Steatorrhea    _5  Yes   _6  No   Constipation      _7  Yes   _8  No         Functional Capacity       Exhaustion      Weakness                  Slowness          Physical Function     PROMIS Fatigue Questionnaire  <20% population mean:      _9 Yes  38.21                                                                                                 Grip strength 3 trials, dominant hand  Trial 1: 20  Trial 2: 18  Trial 3: 18  Average: 18.66                                                           Fails criteria:                 _10 Yes       Time a 15 foot walk twice at his or her usual pace;   Trial 1:  6.21  Trial 2:  6.13  Average:  6.17                                     Fails criteria:                 _11 Yes        PROMIS Physical Function Questionnaire <20%     6.68       Please indicate how well you have followed the instructions given by your physical therapist to do exercises at home:  1.       Not at all  2.       A little  3.       Somewhat regularly  4.       Very regularly   5.       As advised                                                              _12 Yes   Physical   Muscle Wasting _13  None   _14  Mild   _15  Moderate   _16  Severe   Status of subcutaneous fat _17  Good   _18   Mild   _0  Moderate   _1  Severe     Assessment/Plan Malnutrition Status: mild malnutrition related to  chronic illness, as evidenced by   moderate loss of muscle mass ,no loss of subcutaneous fat, no localized or generalized fluid accumulation that may  mask weight loss.         Diet Education   Completed   _2  Yes   _3  No      Printed nutrition education materials given and explained in detail. 2 gm Na,  adequate protein  Mechanism and rationale of diet, recipe modification, methods of application, menu suggestions, flavoring tips, use of spices, given and discussed.   RD name, # given with encouragement to contact prn with questions or issues that may arise regarding nutrition.       Able to demonstrate knowledge   _4  Yes   _5  No       FRAILTY SCORE:  # of yes responses: 2/5                                                                                                                                                       PREFRAIL      Information   Assessment completed by: Merlyn Lot, RD, CNSC

## 2018-07-26 ENCOUNTER — Ambulatory Visit
Admission: RE | Admit: 2018-07-26 | Discharge: 2018-07-26 | Disposition: A | Discharge status: Home / Self Care | Payer: Medicare Other | Source: Ambulatory Visit | Admitting: Radiology

## 2018-07-26 ENCOUNTER — Ambulatory Visit
Admission: RE | Admit: 2018-07-26 | Discharge: 2018-07-26 | Disposition: A | Discharge status: Home / Self Care | Payer: Medicare Other | Source: Ambulatory Visit | Attending: Transplant | Admitting: Transplant

## 2018-07-26 ENCOUNTER — Ambulatory Visit: Payer: Medicare Other | Admitting: Transplant

## 2018-07-26 ENCOUNTER — Other Ambulatory Visit
Admission: RE | Admit: 2018-07-26 | Discharge: 2018-07-26 | Disposition: A | Discharge status: Home / Self Care | Payer: Medicare Other | Source: Ambulatory Visit | Attending: Transplant | Admitting: Transplant

## 2018-07-26 VITALS — BP 126/58 | HR 59 | Temp 96.8°F | Resp 18 | Ht 62.0 in | Wt 138.0 lb

## 2018-07-26 DIAGNOSIS — R918 Other nonspecific abnormal finding of lung field: Secondary | ICD-10-CM

## 2018-07-26 DIAGNOSIS — R590 Localized enlarged lymph nodes: Secondary | ICD-10-CM

## 2018-07-26 DIAGNOSIS — K7210 Chronic hepatic failure without coma: Secondary | ICD-10-CM

## 2018-07-26 DIAGNOSIS — R188 Other ascites: Secondary | ICD-10-CM

## 2018-07-26 DIAGNOSIS — K7460 Unspecified cirrhosis of liver: Secondary | ICD-10-CM

## 2018-07-26 DIAGNOSIS — S2243XD Multiple fractures of ribs, bilateral, subsequent encounter for fracture with routine healing: Secondary | ICD-10-CM

## 2018-07-26 LAB — DOBUTAMINE STRESS ECHO COMPLETE
BMI: 23.8 kg/m2
BP Diastolic: 56 mmHg
BP Systolic: 120 mmHg
BSA: 1.61 m2
Deceleration Time - MV: 247 ms
E/A ratio: 0.87
Echo RV Stroke Work Index Estimate: 770.2 mmHg•mL/m2
Heart Rate: 57 {beats}/min
Height: 62 in
LA Diastolic / Systolic Vol Ratio: 0.1
LA Diastolic Vol BSA Index: 2.4 mL/m2
LA Diastolic Vol Height Index: 2.5 mL/m
LA Diastolic Volume: 3.9 mL
LA Systolic Vol BSA Index: 29.8 mL/m2
LA Systolic Vol Height Index: 30.5 mL/m
LA Systolic Volume: 48 mL
LV ASE Mass BSA Index: 62.8 gm/m2
LV ASE Mass Height 2.7 Index: 29.7 gm/m2.7
LV ASE Mass Height Index: 64.2 gm/m
LV ASE Mass: 101.1 gm
LV CO BSA Index: 2.2 L/min/m2
LV Cardiac Output: 3.53 L/min
LV Diastolic Volume Index: 65.8 mL/m2
LV Posterior Wall Thickness: 0.9 cm
LV SV - LVOT SV Diff: 3.5 mL
LV SV BSA Index: 38.5 mL/m2
LV SV Height Index: 39.4 mL/m
LV Septal Thickness: 0.9 cm
LV Stroke Volume: 62 mL
LV Systolic Volume Index: 27.3 mL/m2
LVED Diameter BSA Index: 2.4 cm/m2
LVED Diameter Height Index: 2.4 cm/m
LVED Diameter: 3.8 cm
LVED Volume BSA Index: 65.8 mL/m2
LVED Volume BSA Index: 66 ml/m2
LVED Volume Height Index: 67.3 mL/m
LVED Volume: 106 mL
LVEF (Volume): 58 %
LVES Volume BSA Index: 27 ml/m2
LVES Volume BSA Index: 27.3 mL/m2
LVES Volume Height Index: 27.9 mL/m
LVES Volume: 44 mL
LVOT Area (calculated): 2.54 cm2
LVOT Cardiac Index: 2.07 L/min/m2
LVOT Cardiac Output: 3.33 L/min
LVOT Diameter: 1.8 cm
LVOT PWD VTI: 23 cm
LVOT PWD Velocity (mean): 63 cm/s
LVOT PWD Velocity (peak): 96 cm/s
LVOT SV BSA Index: 36.33 mL/m2
LVOT SV Height Index: 37.1 mL/m
LVOT Stroke Rate (mean): 160.2 mL/s
LVOT Stroke Rate (peak): 244.2 mL/s
LVOT Stroke Volume: 58.5 cc
MPHR: 150 {beats}/min
MR Regurgitant Fraction (LV SV Mtd): 0.06
MR Regurgitant Volume (LV SV Mtd): 3.5 mL
MV Peak A Velocity: 84 cm/s
MV Peak E Velocity: 73 cm/s
Mitral Annular E/Ea Vel Ratio: 9.13
Mitral Annular Ea Velocity: 8 cm/s
O2 sat peak: 98 %
O2 sat rest: 98 %
Peak DBP: 72 mmHg
Peak Gradient - TR: 20 mmHg
Peak HR: 128 {beats}/min
Peak SBP: 151 mmHg
Percent MPHR: 85.3 %
Pulmonary Vascular Resistance Estimate: 5.7 mmHg
RA Pressure Estimate: 8 mmHg
RPP: 19328 BPM x mmHG
RR Interval: 1052.63 ms
RV Peak Systolic Pressure: 28 mmHg
Stress Atropine Total Dose: 0.5 mg
Stress Dobutamine Total Dose: 40 mcg/kg/min
Stress Peak Stage: 5.09
Stress duration (min): 15 min
Stress duration (sec): 16 s
Weight (lbs): 130 [lb_av]
Weight: 2080 oz

## 2018-07-26 LAB — BILIRUBIN, DIRECT: Bilirubin,Direct: 0.2 mg/dL (ref 0.0–0.3)

## 2018-07-26 LAB — CBC AND DIFFERENTIAL
Baso # K/uL: 0 10*3/uL (ref 0.0–0.1)
Basophil %: 0.8 %
Eos # K/uL: 0.1 10*3/uL (ref 0.0–0.4)
Eosinophil %: 2.1 %
Hematocrit: 35 % (ref 34–45)
Hemoglobin: 10.8 g/dL — ABNORMAL LOW (ref 11.2–15.7)
IMM Granulocytes #: 0 10*3/uL
IMM Granulocytes: 0.4 %
Lymph # K/uL: 1 10*3/uL — ABNORMAL LOW (ref 1.2–3.7)
Lymphocyte %: 19.2 %
MCH: 29 pg/cell (ref 26–32)
MCHC: 31 g/dL — ABNORMAL LOW (ref 32–36)
MCV: 94 fL (ref 79–95)
Mono # K/uL: 0.4 10*3/uL (ref 0.2–0.9)
Monocyte %: 7.4 %
Neut # K/uL: 3.7 10*3/uL (ref 1.6–6.1)
Nucl RBC # K/uL: 0 10*3/uL (ref 0.0–0.0)
Nucl RBC %: 0 /100 WBC (ref 0.0–0.2)
Platelets: 103 10*3/uL — ABNORMAL LOW (ref 160–370)
RBC: 3.7 MIL/uL — ABNORMAL LOW (ref 3.9–5.2)
RDW: 13.6 % (ref 11.7–14.4)
Seg Neut %: 70.1 %
WBC: 5.3 10*3/uL (ref 4.0–10.0)

## 2018-07-26 LAB — HEPATITIS A IGG AB: Hepatitis A IGG: POSITIVE

## 2018-07-26 LAB — URINE MICROSCOPIC (IQ200)
RBC,UA: 14 /hpf — ABNORMAL HIGH (ref 0–2)
WBC,UA: >180 /hpf — ABNORMAL HIGH (ref 0–5)

## 2018-07-26 LAB — FIBRINOGEN: Fibrinogen: 279 mg/dL (ref 172–409)

## 2018-07-26 LAB — VARICELLA ZOSTER IGG AB: VZV IgG: POSITIVE

## 2018-07-26 LAB — URINALYSIS WITH REFLEX TO MICROSCOPIC
Glucose,UA: 50 mg/dL — AB
Ketones, UA: NEGATIVE
Nitrite,UA: NEGATIVE
Protein,UA: NEGATIVE mg/dL
Specific Gravity,UA: 1.008 (ref 1.002–1.030)
pH,UA: 6 (ref 5.0–8.0)

## 2018-07-26 LAB — DRUG SCREEN CHEMICAL DEPENDENCY, URINE
Amphetamine,UR: NEGATIVE
Benzodiazepinen,UR: NEGATIVE
Cocaine/Metab,UR: NEGATIVE
Opiates,UR: NEGATIVE
THC Metabolite,UR: NEGATIVE

## 2018-07-26 LAB — SYPHILIS SCREEN
Syphilis Screen: NEGATIVE
Syphilis Status: NONREACTIVE

## 2018-07-26 LAB — AFP TUMOR MARKER: AFP (eff. 4-2010): <1 IU/mL (ref 0–7)

## 2018-07-26 LAB — HEPATITIS B & C PROF
HBV Core Ab: NEGATIVE
HBV S Ab Quant: 0 m[IU]/mL
HBV S Ab: NEGATIVE
HBV S Ag: NEGATIVE
Hep C Ab: NEGATIVE

## 2018-07-26 LAB — COMPREHENSIVE METABOLIC PANEL
ALT: 52 U/L — ABNORMAL HIGH (ref 0–35)
AST: 58 U/L — ABNORMAL HIGH (ref 0–35)
Albumin: 4.4 g/dL (ref 3.5–5.2)
Alk Phos: 252 U/L — ABNORMAL HIGH (ref 35–105)
Anion Gap: 14 (ref 7–16)
Bilirubin,Total: 0.9 mg/dL (ref 0.0–1.2)
CO2: 27 mmol/L (ref 20–28)
Calcium: 9.7 mg/dL (ref 8.6–10.2)
Chloride: 97 mmol/L (ref 96–108)
Creatinine: 1.45 mg/dL — ABNORMAL HIGH (ref 0.51–0.95)
GFR,Black: 42 * — AB
GFR,Caucasian: 36 * — AB
Glucose: 250 mg/dL — ABNORMAL HIGH (ref 60–99)
Lab: 30 mg/dL — ABNORMAL HIGH (ref 6–20)
Potassium: 3.9 mmol/L (ref 3.3–5.1)
Sodium: 138 mmol/L (ref 133–145)
Total Protein: 7.7 g/dL (ref 6.3–7.7)

## 2018-07-26 LAB — PROTIME-INR
INR: 1.2 — ABNORMAL HIGH (ref 0.9–1.1)
Protime: 13.5 s — ABNORMAL HIGH (ref 10.0–12.9)

## 2018-07-26 LAB — CERULOPLASMIN: Ceruloplasmin: 11 mg/dL — ABNORMAL LOW (ref 16–45)

## 2018-07-26 LAB — TIBC
Iron: 44 ug/dL (ref 34–165)
TIBC: 348 ug/dL (ref 250–450)
Transferrin Saturation: 13 % — ABNORMAL LOW (ref 15–50)

## 2018-07-26 LAB — HIV 1&2 ANTIGEN/ANTIBODY: HIV 1&2 ANTIGEN/ANTIBODY: NONREACTIVE

## 2018-07-26 LAB — FERRITIN: Ferritin: 30 ng/mL (ref 10–120)

## 2018-07-26 LAB — ALPHA-1-ANTITRYPSIN: A-1 Antitrypsin: 127 mg/dL (ref 90–200)

## 2018-07-26 LAB — COOMBS TEST, INDIRECT, QUALITATIVE: Antibody Screen: NEGATIVE

## 2018-07-26 LAB — APTT: aPTT: 31.4 s (ref 25.8–37.9)

## 2018-07-26 LAB — MUMPS ANTIBODY, IGG: Mumps IgG: POSITIVE

## 2018-07-26 LAB — ETHANOL, URINE: Ethanol,UR: NEGATIVE

## 2018-07-26 LAB — HSV 1 ANTIBODY, IGG: HSV 1 IgG: >8 AI

## 2018-07-26 LAB — RUBELLA ANTIBODY, IGG: Rubella IgG AB: POSITIVE

## 2018-07-26 LAB — CHOLESTEROL, TOTAL: Cholesterol: 138 mg/dL

## 2018-07-26 LAB — MEASLES IGG AB: Measles IgG: UNDETERMINED

## 2018-07-26 LAB — HSV 2 ANTIBODY, IGG: HSV 2 IgG: <0.2 AI

## 2018-07-26 LAB — ETHYL GLUCURONIDE, UR: Ethyl glucuronide, Ur: NEGATIVE

## 2018-07-26 LAB — ABO/RH: ABO RH Blood Type: A POS

## 2018-07-26 LAB — CEA: CEA: 15.6 ng/mL — ABNORMAL HIGH (ref 0.0–4.7)

## 2018-07-26 MED ORDER — PERFLUTREN PROTEIN A MICROSPH (OPTISON) IV SUSP *I*
1.5000 mL | INTRAVENOUS | Status: DC | PRN
Start: 2018-07-26 — End: 2018-07-27
  Administered 2018-07-26: 0.75 mL via INTRAVENOUS
  Administered 2018-07-26: 1.5 mL via INTRAVENOUS

## 2018-07-26 MED ORDER — IOHEXOL 350 MG/ML (OMNIPAQUE) IV SOLN *I*
1.0000 mL | Freq: Once | INTRAVENOUS | Status: AC
Start: 2018-07-26 — End: 2018-07-26
  Administered 2018-07-26: 90 mL via INTRAVENOUS

## 2018-07-26 MED ORDER — STERILE WATER FOR IRRIGATION IR SOLN *I*
900.0000 mL | Freq: Once | Status: AC
Start: 2018-07-26 — End: 2018-07-26
  Administered 2018-07-26: 900 mL via ORAL

## 2018-07-26 MED ORDER — SODIUM CHLORIDE 0.9 % INJ (FLUSH) WRAPPED *I*
10.0000 mL | Status: DC | PRN
Start: 2018-07-26 — End: 2018-07-26

## 2018-07-26 MED ORDER — DOBUTAMINE 4 MG/ML IN D5W IV SOLN (ADULT STANDARD) *I*
5.0000 ug/kg/min | INTRAVENOUS | Status: DC
Start: 2018-07-26 — End: 2018-07-27
  Administered 2018-07-26: 20 ug/kg/min via INTRAVENOUS
  Administered 2018-07-26: 30 ug/kg/min via INTRAVENOUS
  Administered 2018-07-26: 40 ug/kg/min via INTRAVENOUS
  Administered 2018-07-26: 10 ug/kg/min via INTRAVENOUS
  Administered 2018-07-26: 5 ug/kg/min via INTRAVENOUS

## 2018-07-26 MED ORDER — ATROPINE SULFATE 0.1 MG/ML IJ SOLN *I*
0.5000 mg | INTRAMUSCULAR | Status: DC | PRN
Start: 2018-07-26 — End: 2018-07-27
  Administered 2018-07-26: 0.5 mg via INTRAVENOUS

## 2018-07-26 NOTE — Progress Notes (Signed)
walktest     1st attempt 6.21seconds   2nd attempt 6.13 seconds

## 2018-07-26 NOTE — Discharge Instructions (Signed)
Patient is here for a Dobutamine stress test Instructed patient to resume medications and to follow up with referring provider. Patient verbalized understanding.PIV left in place for next appointment @ [address] Verbal handoff given to [place]   Patient provided instructions for management of PIV.

## 2018-07-27 ENCOUNTER — Ambulatory Visit: Payer: Medicare Other | Attending: Surgery | Admitting: Rehabilitative and Restorative Service Providers"

## 2018-07-27 ENCOUNTER — Encounter: Payer: Self-pay | Admitting: Cardiology

## 2018-07-27 ENCOUNTER — Ambulatory Visit: Payer: Medicare Other | Attending: Gastroenterology | Admitting: Gastroenterology

## 2018-07-27 ENCOUNTER — Ambulatory Visit: Payer: Medicare Other | Attending: Transplant Surgery Liver and Kidney | Admitting: Transplant Surgery Liver and Kidney

## 2018-07-27 ENCOUNTER — Ambulatory Visit: Payer: Medicare Other | Attending: Cardiology | Admitting: Cardiology

## 2018-07-27 ENCOUNTER — Other Ambulatory Visit: Payer: Self-pay | Admitting: Cardiology

## 2018-07-27 VITALS — BP 135/59 | HR 66 | Temp 96.1°F | Resp 18 | Ht 62.0 in | Wt 130.0 lb

## 2018-07-27 VITALS — BP 118/61 | HR 60 | Ht 62.0 in | Wt 143.4 lb

## 2018-07-27 DIAGNOSIS — K7460 Unspecified cirrhosis of liver: Secondary | ICD-10-CM | POA: Insufficient documentation

## 2018-07-27 DIAGNOSIS — K7290 Hepatic failure, unspecified without coma: Secondary | ICD-10-CM | POA: Insufficient documentation

## 2018-07-27 DIAGNOSIS — I10 Essential (primary) hypertension: Secondary | ICD-10-CM | POA: Insufficient documentation

## 2018-07-27 DIAGNOSIS — I2510 Atherosclerotic heart disease of native coronary artery without angina pectoris: Secondary | ICD-10-CM | POA: Insufficient documentation

## 2018-07-27 DIAGNOSIS — E785 Hyperlipidemia, unspecified: Secondary | ICD-10-CM | POA: Insufficient documentation

## 2018-07-27 DIAGNOSIS — E119 Type 2 diabetes mellitus without complications: Secondary | ICD-10-CM | POA: Insufficient documentation

## 2018-07-27 DIAGNOSIS — K7210 Chronic hepatic failure without coma: Secondary | ICD-10-CM

## 2018-07-27 DIAGNOSIS — R188 Other ascites: Secondary | ICD-10-CM | POA: Insufficient documentation

## 2018-07-27 LAB — TOXOPLASMA GONDII ANTIBODY, IGG: Toxoplasma IgG: 928 IU/mL

## 2018-07-27 LAB — BB PROTOCOL REVIEW

## 2018-07-27 LAB — BLOOD BANK TRANSFUSION

## 2018-07-27 LAB — ANTINUCLEAR ANTIBODY SCREEN: ANA Screen: NEGATIVE

## 2018-07-27 LAB — CYTOMEGALOVIRUS IGG AB: CMV IgG: POSITIVE

## 2018-07-27 LAB — EPSTEIN-BARR VIRUS VCA, IGG: EBV IgG: POSITIVE

## 2018-07-27 NOTE — Progress Notes (Addendum)
[facility] Liver Transplant Hepatology Evaluation     Referring Physician: Dr. Kimberlee Nearing  Primary Care Physician: [name], FNP  Primary Gastroenterologist: Dion Body, DO    We had the pleasure of seeing  Cheyenne Butler in pre-liver transplantation clinic as a new patient referral. As you know, she is a 72 y.o. 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    - Recent Hospitalizations: multiple hospitalizations, most recent at [facility]. Reports patient with worsening renal function, PSE. UTI. She underwent a colonoscopy 03/24/2018 at [facility] revealed rectal varices, internal hemorrhoids. 2 mm hepatic flexure polyp removed. (repeat colonoscopy due in 5 years). EGD 6/27 showed non bleeding grade 2 varices, 4 bands placed, moderate portal HTN noted. EGD 8/9 showed grade 2 varices, with bands placed. Repeat EGD 05/16/2018 showed oozing from previous banding site, no other varices noted. Multiple bleeding erosions noted in stomach: c/w bleeding erosive gastropathy.  CT A/P without contrast 05/12/2018 showed small to moderate ascites, gallbladder stones noted. Has had 4+ admissions for HE, UTI, ? Non-occlusive PvT. CT on 9/4 no clot.  - Pertinent past medical history includes: cardiac disease, including MI status post stent placement x 2 in 09/2016, DM and bladder prolapse.   - Pertinent past surgical history includes: cardiac cath, cardiac angioplasty, and cardiac stent placement; tubal ligation, banding for esophageal varices  - Potential Living Donors: unknown    History of Present Illness:  72 y.o. woman with history of ETOH cirrhosis however patient denies heavy ETOH use in the past. Complicated by ascites, GIB with banding of esophageal varices, and hepatic encephalopathy requiring multiple admission. Her cirrhosis was  first diagnosed a few years ago.     She was referred to the Transplant Hepatology Clinic for evaluation. Per Dr. Kimberlee Nearing assessment at Hepatology clinic, "although she has a low MELD she may benefit from an OLT evaluation knowing that it would at least require a live donor if the group feels she can benefit. Her cardiac status is of primary concern".    Today the patient is doing ok. She reports baseline chronic fatigue and limited physical activity. Denies jaundice, icterus pruritis. Reports history of abdominal distention or ascites. Her last paracentesis was done 6 months ago and that was her first and last one. She reports intermittent lower extremity edema.   She is on spironolactone 38m daily and furosemide 263mdaily, and weekly infusions of albumin. Reports marked improvement after started on albumin. She is following a low sodium diet. Denies abdominal pain. Denies nausea, vomiting, fever, chills. Denies easy bruising, GI bleeding. She has history of GI bleeding associated to esophageal varices that has manifested as melena/hematochezia, denies history of hematemesis. She has required banding of esophageal varices in the past, the most recent ~02/2018. She had an EGD earlier this month with Dr. WeArrie Easternhat showed small esophageal varices that flatten with insufflation, no banding was required at that time. She denies GI bleeding at this moment. The patient and the family report frequent episodes of forgetfulness but deny confusion. She sleeps well. She is on lactulose and rifaximin, moving bowels 4-6 BMs daily. She will do miralax if is falling behind in bowel movements.     MELD-Na score: 12 at 07/26/2018 10:50 AM  MELD score: 12 at 07/26/2018 10:50 AM  Calculated from:  Serum Creatinine: 1.45 mg/dL at 07/26/2018 10:50 AM  Serum Sodium: 138 mmol/L (Rounded to 137 mmol/L) at 07/26/2018 10:50 AM  Total Bilirubin: 0.9 mg/dL (Rounded to 1 mg/dL) at 07/26/2018 10:50 AM  INR(ratio): 1.2 at 07/26/2018 10:50  AM  Age: 14 years    Past Medical History:  Past Medical History:   Diagnosis Date    Angina pectoris     Bladder prolapse, female, acquired     CAD (coronary artery disease)     Jan 2018 - stents placed (2)    Cirrhosis of liver with ascites, unspecified hepatic cirrhosis type     DM (diabetes mellitus), type 2     Dyslipidemia     Esophageal reflux     Grade II diastolic dysfunction     Hemorrhoid     High blood pressure     History of blood transfusion     Hypertension     Ischemic heart disease     Urinary incontinence         Past Surgical History:   Past Surgical History:   Procedure Laterality Date    CARDIAC CATHETERIZATION  10/2010    CORONARY ANGIOPLASTY WITH STENT PLACEMENT  09/2016    TUBAL LIGATION      variceal banding         Social History:   Social History     Tobacco Use    Smoking status: Never Smoker    Smokeless tobacco: Never Used   Substance Use Topics    Alcohol use: No       Family History:   family history includes Colon cancer in her brother.    ROS    Vitals:   There were no vitals filed for this visit.  There is no height or weight on file to calculate BMI.    Physical Exam:  General appearance: alert, appears stated age and cooperative, chronically ill appearing   Eyes: EOM, absence of icterus.  Ears, Nose, Mouth and throat: MMM, clear o/p without ulcers/lesions  Neck: supple, symmetrical, trachea midline   Cardiovascular: regular rate and rhythm, S1, S2 normal, no murmur, rubs, or gallop.  No JVD and extremities with no cyanosis or edema.  Pulses 2+ and symmetric   Respiratory: clear to auscultation bilaterally. No crackles or wheezing.  Gastrointestinal: abdomen soft, non-tender; +bowel sounds in all 4 quadrants; no masses, no organomegaly. She has presence of ascites,  absence of caput medusa, absence of surgical scar, absence of hernia.   Rectal examination deferred.  Skin:She has absence of jaundice, presence of spider angiomas. No lesions.   Neurological:  grossly normal, presence of mild asterixis  Extremities: extremities warm and dry. 1+ edema    Functional Status:  Karnofsky Performance Status -  Score-50- Requires considerable assistance and frequent medical care.    LAB DATA:  Laboratory Data  Lab Results   Component Value Date    NA 138 07/26/2018    K 3.9 07/26/2018    CL 97 07/26/2018    CO2 27 07/26/2018    UN 30 (H) 07/26/2018    CREAT 1.45 (H) 07/26/2018     Lab Results   Component Value Date    ALK 252 (H) 07/26/2018    TB 0.9 07/26/2018    DB 0.2 07/26/2018    ALB 4.4 07/26/2018    ALT 52 (H) 07/26/2018    AST 58 (H) 07/26/2018     Lab Results   Component Value Date    WBC 5.3 07/26/2018    HGB 10.8 (L) 07/26/2018  HCT 35 07/26/2018    MCV 94 07/26/2018    PLT 103 (L) 07/26/2018     No results found for: LIP, AMY   Lab Results   Component Value Date    INR 1.2 (H) 07/26/2018       Comprehensive lab study date  Iron: 44  TIBC: 348  Transferrin Saturation: 13 (L)  Ferritin: 30  Ceruloplasmin: 11 (L)  A-1 Antitrypsin: 127  ANA Screen: NEG  F-Actin IgG: 15  Mitochondrial Ab: 5.8  AFP (eff. 12-2008): <1  CEA: 15.6 (H)    Tox screen  07/26/2018 13:50  Amphetamine,UR: NEG  Benzodiazepinen,UR: NEG  THC Metabolite,UR: NEG  Cocaine/Metab,UR: NEG  Ethanol,UR: NEG  Opiates,UR: NEG  Ethyl glucuronide, Ur: NEG    Blood type: A positive    MELD-Na score: 12 at 07/26/2018 10:50 AM  MELD score: 12 at 07/26/2018 10:50 AM  Calculated from:  Serum Creatinine: 1.45 mg/dL at 07/26/2018 10:50 AM  Serum Sodium: 138 mmol/L (Rounded to 137 mmol/L) at 07/26/2018 10:50 AM  Total Bilirubin: 0.9 mg/dL (Rounded to 1 mg/dL) at 07/26/2018 10:50 AM  INR(ratio): 1.2 at 07/26/2018 10:50 AM  Age: 11 years    Assessment:  Cheyenne Butler is a 72 y.o. 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     Plan:   # EtOH Cirrhosis   - MELD-Na score: 12  - Update labs every month.   - Educated patient on importance of staying as active as possible to preserve functional status.  - Should avoid all herbal remedies and NSAIDs.   - Okay to take tylenol as needed for generalized aches/pains with a maximum daily dose of 2,012m per day.   - Educated patient that absolutely no alcohol and/or drugs that are not prescribed to patient are safe for liver.   - Would recommend that patient have pneumococcal and yearly influenza vaccine through PCP if not done already.   - Would ask that PCP arrange bone density scan (DEXA scan) every 3 years with aggressive management of bone loss if present.    - Would ask that PCP obtain vitamin D level twice yearly and repletion if abnormal.    # Fluid retention/Ascites   - Low sodium (less than 2,0025mper day), high protein diet (1.6g/kg).   - Patient was seen by transplant dietician yesterday during transplant evaluation and given educational handouts on low sodium/high protein diets.   - In patients with ascites, avoid non-dipyridamole CCB and ace inhibitors as they worsen fluid retention  - Home regimen includes: spironolactone 2557maily and furosemide 50m48mily, and weekly infusions of albumin.  - Hx of SBP: unknown  - Should continue to weigh self daily. Identified that patient should contact office if greater than 5lb weight gain in one week.    In patients with diuretic-sensitive ascites, the removal of 5 liters of fluid is sufficient to reduce intraabdominal pressure, at which point sodium restriction and diuretics are continued   6 to 8 g of albumin should be given per liter of fluid removed in the case of larger-volume paracentesis, >5L    # Hepatic encephalopathy  - Continue lactulose and titrate for 3-4 large bowel movements per day  - Continue rifaximin 550mg13m  - Hopefully with more renal protection may see reduction in episodes.  - Educated patient on  importance of eating a high protein bedtime snack to reduce overnight muscle wasting  - Medications to Avoid: Benzos, NSAIDs, sedating medications  - Opioids should be used only when absolutely necessary, at lower dose and increased interval    # HCC Surveillance   - Should have abdominal imaging and AFP every 6 months.   - Last AFP: <1 (07/26/2018). Repeat due 01/2019.   - Last Ultrasound/Imaging: CT, no focal lesions (07/26/2018). Repeat due 01/2019.  - Treatment History: no    # Variceal screening.   - Last EGD on 06/30/2018: Small esophageal varices that did not require further banding. Portal hypertensive gastropathy  - Recommend repeat in one year  - Has undergone banding in the past?: yes  - Is patient on beta blockers?: no     # Transplant Candidacy  - Will present this case at the upcoming transplant committee meeting- Candidacy in terms of survival benefit and quality of life.    Infectious Disease:   - Immunization Status: Measals, Mumps, Rubella, Varicella IGG, Hepatitis A, B titers:  Reviewed and will require re-immunization for HBV. Also equivocal Measles.  - TB Screening: done, result is pending. If positive will require referral to Transplant ID.  - Dental Clearance: not done yet  - Infection History/Risk: Hepatitis C Ab: negative, HIV: non-reactive.    Cardiac Testing:   - Dobutamine Stress Test: Date 07/26/2018. Impression: Normal resting LVEF without regional wall motion abnormalities. Mild aortic and mitral valve sclerosis with mild mitral regurgitaton. Normal right heart size and function with mild to moderate tricuspid regurgitation and estimated normal pulmonary artery systolic pressures. Negative dobutamine stress echo for myocardial ischemia at the diagnostic workload achieved. Target Heart Rate Reached: yes. Heart Rate MPHR = 150 bpm, % MPHR = 85.3%. Ischemia negative  - Cardiac Catheterization: in the past  - Cardiac Consult Required: not determined yet    Pulmonology:   - Chest CT: Date  07/26/2018. Impression: pending.  - If lung nodules and or marijuana use transplant ID consult: awaiting result  - Pulmonary Consult Required: to be determined    Abdominal/Pelvis Imaging:   - CT/MRI: Date 07/26/2018. Impression: 1. Cirrhotic liver morphology. No suspicious liver lesion visualized. 2. Trace ascites, decreased from prior CT. 3. Spleen is at the upper limits of normal in size.  - Hepatic Vasculature: Pertinent Findings: Main portal vein is patent. Portosystemic collaterals, including small lower paraesophageal varices. History of portal vein thrombus:  Yes?.    Endocrine:   - Patient is a diabetic: yes  - Endocrine Referral Required: to be determined    Neurology:   - Neurology Referral Required: to be determined    Health Maintenance:  - Colonoscopy (age 34 or greater, or sooner for family history): Up to date: had one recently for bleeding, per patient on 03/24/2018 at [facility] revealed rectal varices, internal hemorrhoids. Unknown if has had one recently for CRCS purposes.   - Pap Smear: unknown  - Mammogram: unknown  - Urinalysis (Evaluate for hematuria):      Lab results: 07/26/18  1350   Appearance,UR 2+ Cloudy*   Color, UA Lt Yellow   Glucose,UA 50*   Ketones, UA NEG   Specific Gravity,UA 1.008   Blood,UA 1+*   pH,UA 6.0   Protein,UA NEG   Nitrite,UA NEG   Leuk Esterase,UA 3+*   RBC,UA 14*   WBC,UA >180*     (For age 41 or greater, or sooner for family history of bladder cancer): Up to date:  yes    Psycho/Social Concerns:    - History of Alcohol Use: yes. Not currently. Last use years ago.   - History of Drug Use: no    - History of Smoking: no   - Support Concerns: no Identifies son and daughter in law as primary support    Other:  - Request for outside records to include: colonoscopy, pap-smear, mammogram, would need dental eval  - All of the above testing was reviewed by myself and Dr. Alvina Chou.   - The following items will need to be reviewed prior to selection committee as  they were not available for review at the time of patients clinic visit: CT chest, abd/pelvis impression. Colonoscopy, pap-smear, mammogram, would need dental eval  - All of the above testing was reviewed by myself and Dr. Alvina Chou.  - The case will be presented in liver transplant selection committee with further recommendations to follow.    Patient's case reviewed and plan discussed with Dr. Alvina Chou.    We will see her back for clinical follow up in 1-2 months or sooner if medically necessary.    Kristen Cardinal, MD     .I saw and evaluated the patient. I agree with the resident's/fellow's findings and plan of care as documented above.    Gaynelle Adu, MBBS

## 2018-07-27 NOTE — Patient Instructions (Signed)
1.We recommend that you have a flu and pneumonia vaccine with your primary care doctor.  2. Your blood work also shows that you are in need of a hepatitis B vaccine-your primary care can also provide this for you  3. We will discuss your case at our selection committee this Friday to discuss your candidacy in terms of survival benefit and quality of life. I wil contact you early next week with the outcome of this discussion.

## 2018-07-27 NOTE — H&P (Signed)
[facility] Transplant Surgery Evaluation    HPI:    Dear Dr. Elisabeth Cara, Grayling Congress, FNP    We had the pleasure of meeting your patient, Ms. Cheyenne Butler, in clinic today for evaluation of their liver transplant surgical candidacy.  As you know she is a 72 y.o. 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 The patient denies heavy alcohol use in the past and has been abstinent from alcohol for many years. Her liver disease has been complicated by ascites, GIB with banding of esophageal varices, and hepatic encephalopathy requiring multiple admission. She recently had an EGD which showed small esophageal varices that did not require banding. Currently, she denies fatigue and jaundice (previously had jaundice about 9 months ago), and pruritus. Currently no distension or ascites, previous paracentesis and is on diuretics. Reports intermittent lower extremity edema. She follows a low sodium diet. Today, she denies abdominal pain, nausea, vomiting, fever, chills. Denies easy bruising, GI bleeding. No hematemesis. Denies melena or hematochezia. Denies confusion and any concentration issues. Moving bowels 3-4 BMs daily.  Has had 4+ admissions for HE, UTI, ? Non-occlusive PvT. CT on 9/4 no clot.    They report stigmata of liver disease which includes Fatigue, Ascites  On diuretics, GI Bleed  EGD with banding and Encephalopathy  On Lactulose.      MELD-Na score: 12 at 07/26/2018 10:50 AM  MELD score: 12 at 07/26/2018 10:50 AM  Calculated from:  Serum Creatinine: 1.45 mg/dL at 07/26/2018 10:50 AM  Serum Sodium: 138 mmol/L (Rounded to 137 mmol/L) at 07/26/2018 10:50 AM  Total Bilirubin: 0.9 mg/dL (Rounded to 1 mg/dL) at 07/26/2018 10:50 AM  INR(ratio): 1.2 at 07/26/2018 10:50 AM  Age: 27 years      Recent Hospitalization 6 weeks ago for increased ammonia level   Family hx  of Sudden Cardiac Death? Personal Hx of MI? Yes 11/17/16   History of DVT/PE/Family history of same or hypercoagulable disorder Denies   Recent Illness/Sick Contacts:  No   Abdominal Surgery: Denies   Previous anaesthesia issues (immediate family included) No   Currently on Pain Meds? (who is following) No     Functional Status  Ability to perform ADLs Yes     At least 4 METS (walk up one flight of stairs or perform heavy housework like scrubbing floors)  If <4, needs cards clearance Some tiredness with walking up one flight     60 - Remains on medications, now has symptom breakthrough of liver disease    The patient's blood type is A RH POS, current MELD score is 12.    Potential Living Donors: Son and other grandchildren    Past Medical History:   Diagnosis Date    Angina pectoris     Bladder prolapse, female, acquired     CAD (coronary artery disease)     Jan 2018 - stents placed (2)    Cirrhosis of liver with ascites, unspecified hepatic cirrhosis type     DM (diabetes mellitus), type 2     Dyslipidemia     Esophageal reflux     Grade II diastolic dysfunction     Hemorrhoid     High blood pressure     History of blood transfusion     Hypertension     Ischemic heart disease     Urinary incontinence  Past Surgical History:   Procedure Laterality Date    CARDIAC CATHETERIZATION  10/2010    CORONARY ANGIOPLASTY WITH STENT PLACEMENT  09/2016    TUBAL LIGATION      variceal banding         Family History   Problem Relation Age of Onset    Colon cancer Brother     Liver Disease Neg Hx        Allergies   Allergen Reactions    Sulfa Antibiotics Hives       Current Outpatient Medications on File Prior to Visit   Medication Sig Dispense Refill    Albumin human 25% 25 % IV Administer 200 mLs (50 g total) into the vein once a week   To obtain a serum albumin level of 3.5 for 2 consecutive weeks then discontinue. 50 mL 3    insulin glargine (LANTUS) 100 UNIT/ML injection vial Inject 45 Units into the  skin nightly         oxybutynin (DITROPAN) 5 MG tablet Take 5 mg by mouth 3 times daily      pantoprazole (PROTONIX) 40 MG EC tablet Take 40 mg by mouth 2 times daily   Swallow whole. Do not crush, break, or chew.       gabapentin (NEURONTIN) 400 MG capsule Take 400 mg by mouth nightly         metoprolol (TOPROL-XL) 25 MG 24 hr tablet Take 50 mg by mouth 2 times daily   Do not crush or chew. May be divided.       atorvastatin (LIPITOR) 40 MG tablet Take 40 mg by mouth daily      nitroglycerin (NITROSTAT) 0.4 MG SL tablet Place 0.4 mg under the tongue every 5 minutes as needed for Chest pain   May repeat 2 times then call 911 if pain persists.      spironolactone (ALDACTONE) 50 MG tablet Take 50 mg by mouth daily         rifAXIMin (XIFAXAN) 550 MG tablet Take 550 mg by mouth 2 times daily       No current facility-administered medications on file prior to visit.        Social History     Socioeconomic History    Marital status: Married     Spouse name: Not on file    Number of children: Not on file    Years of education: Not on file    Highest education level: Not on file   Occupational History    Not on file   Tobacco Use    Smoking status: Never Smoker    Smokeless tobacco: Never Used   Substance and Sexual Activity    Alcohol use: No    Drug use: No    Sexual activity: Not on file   Social History Narrative    Not on file         Review of Systems   Constitutional: Positive for weight loss. Negative for chills, fever and malaise/fatigue.   HENT: Negative.    Eyes: Negative.    Respiratory: Negative for cough and shortness of breath.    Cardiovascular: Negative for chest pain and palpitations.   Gastrointestinal: Negative for abdominal pain, blood in stool, heartburn, nausea and vomiting.   Genitourinary: Negative.    Musculoskeletal: Negative.    Skin: Negative for itching and rash.   Neurological: Negative for dizziness and headaches.   Endo/Heme/Allergies: Bruises/bleeds easily.    Psychiatric/Behavioral: Negative.     (  Full 12 pt ROS)    Objective:    BP 135/59    Pulse 66    Temp 35.6 C (96.1 F)    Resp 18    Ht 1.575 m (_0 )    Wt 59 kg (130 lb)    SpO2 99%    BMI 23.78 kg/m   Body mass index is 23.78 kg/m.    Physical Exam   Constitutional: She is oriented to person, place, and time and well-developed, well-nourished, and in no distress. No distress.   HENT:   Head: Normocephalic and atraumatic.   Eyes: Pupils are equal, round, and reactive to light. Conjunctivae are normal.   Neck: Normal range of motion. Neck supple.   Cardiovascular: Normal rate, regular rhythm and normal heart sounds.   Pulmonary/Chest: Effort normal and breath sounds normal.   Abdominal: Soft. She exhibits no distension. There is no tenderness. Musculoskeletal: Normal range of motion.     Neurological: She is alert and oriented to person, place, and time.   Skin: Skin is warm and dry.   Psychiatric: Affect normal.    (Full head to toe exam)    Encephalopathy Stage/Grade Choose one  (None, 1-2, 3-4)    Grade 0 - Minimal hepatic encephalopathy (also known as CHE [27] and previously known subclinical hepatic encephalopathy); lack of detectable changes in personality or behavior; minimal changes in memory, concentration, intellectual function, and coordination; asterixis is absent.    Grade 1 - Trivial lack of awareness; shortened attention span; impaired addition or subtraction; hypersomnia, insomnia, or inversion of sleep pattern; euphoria, depression, or irritability; mild confusion; slowing of ability to perform mental tasks    Grade 2 - Lethargy or apathy; disorientation; inappropriate behavior; slurred speech; obvious asterixis; drowsiness, lethargy, gross deficits in ability to perform mental tasks, obvious personality changes, inappropriate behavior, and intermittent disorientation, usually regarding time    Grade 3 - Somnolent but can be aroused; unable to perform mental tasks; disorientation about time  and place; marked confusion; amnesia; occasional fits of rage; present but incomprehensible speech    Grade 4 - Coma with or without response to painful stimuli 0   Ascites - choose one  (Absent, Slight- controlled by diureticss, At least moderate despite treatment) Absent           Lab results: 07/26/18  1050   WBC 5.3   Hemoglobin 10.8*   Hematocrit 35   RBC 3.7*   Platelets 103*           Lab results: 07/26/18  1050   Sodium 138   Potassium 3.9   Chloride 97   CO2 27   UN 30*   Creatinine 1.45*   GFR,Caucasian 36*   GFR,Black 42*   Glucose 250*   Calcium 9.7   Total Protein 7.7   Albumin 4.4   ALT 52*   AST 58*   Alk Phos 252*   Bilirubin,Total 0.9           Lab results: 07/26/18  1050   INR 1.2*         No images are attached to the encounter.  No results found.  Dobutamine Stress Echo Complete    Result Date: 07/26/2018  Normal resting LVEF without regional wall motion abnormalities. Mild aortic and mitral valve sclerosis with mild mitral regurgitaton. Normal right heart size and function with mild to moderate tricuspid regurgitation and estimated normal pulmonary artery systolic pressures. Negative dobutamine stress echo for myocardial ischemia at the diagnostic  workload achieved.      Assessment/Plan:     Linzi Ohlinger is a 72 y.o. female with HTN, previous MI in 2018 with 2 stents, and EtOH cirrhosis though not a heavy past drinker who is presenting for liver transplant evaluation. She is doing well today and has her symptoms mostly managed. However, she has required past admissions for encephalopathy, and is currently managed with albumin, diuretics, and lactulose. Her MELD is 12 today. She understands the liver transplant process and has good support system with potential donors, though she does have previous cardiac history.      Cardiac: Dobutamine stress test (07/26/18): Normal resting LVEF without regional wall motion abnormalities. Mild aortic and mitral valve sclerosis with mild mitral  regurgitaton. Normal right heart size and function with mild to moderate tricuspid regurgitation and estimated normal pulmonary artery systolic pressures. Negative dobutamine stress echo for myocardial ischemia at the diagnostic workload achieved.  - Right heart function: Normal R heart size and function with mild to moderate tricuspid regurgitation    Pulmonary: CT chest from 07/26/18  PFTs: To be considered if active pulmonary disease. Will be discussed at Transplant selection committee to determine if further testing is required.    Decompensated Disease:  Portal hypertension, varices with prior banding, encephalopathy    Liver Lesion/other malignancy: CT a/p from 07/26/18    Hepatic Vasculature: CT a/p from 07/26/18    Previous Pertinent Abdominal Surgical Hx: Denies    Co-morbidities: Insulin-dependent diabetes, previous MI in 2018 with 2 stents placed    DM: Insulin-dependent      HTN:   BP Readings from Last 3 Encounters:   07/27/18 135/59   07/26/18 126/58   06/30/18 117/59         Author: Jenness Corner, MD as of: 07/27/2018  at: 9:00 AM        Attending Attestation:    Ms. Refugio Mcconico is a 72 y.o. female with ESLD secondary to EtOH though never a heavy drinker. They were seen today for evaluation of liver transplant candidacy. Their stigmata of liver disease include ascites managed with diuretics, encephalopathy on lactulose, prior jaundice, and esophageal varices that have required banding.     I first discussed the rationale for liver transplantation as a therapy primarily designed to treat the acute and chronic liver failure with an imminent threat to life and associated significant mortality left untreated within the next several years. I also discussed the use of liver transplantation as a treatment for primary liver cancer within certain guidelines. As such, I discussed the relative expected mortalities for liver transplantation being approximately 10-15% within the first year at approximately 30%  at 5 years. We also specifically shared our program specific survival statistics.     I then discussed the general allocation scheme for livers within the state of [place]. I specifically reviewed our relatively poor organs supply, the option to list at another center outside of our region, as well as the options of receiving various types of donors, including: Living donors, standard criteria cadaver donors, as well as extended criteria donor's. I elaborated upon the MELD scoring system and its use in the allocation of liver grafts. I also discussed the relatively high scores necessary to receive any cadaver liver graft within our [place] region.     I discussed the necessity for hepatocellular carcinoma screening in any patient with cirrhosis. I also discussed the allocation of liver graphs for patients with small hepatocellular carcinomas within the region using the regionally  approved MELD exception point scheme.     I then discussed the surgical risks of liver transplantation including the mortality as reviewed above and specifically the risks of graft failure, need for retransplant, technical complications of arterial or venous thrombosis, bile duct stricture or leak, wound infection or hernia, and the general complications of bleeding and infection. I further discussed other potential complications of transplantation including graft rejection, infection related to immunosuppression, transmission of donor-related diseases, and the unanticipated issues of renal failure, respiratory failure, and neurologic injury.     I specifically discussed the need for lifelong immunosuppressive therapy. I also discussed the infectious, oncologic, and metabolic side effects of immunosuppressive agents. We also discussed the nephrologic and neurologic impairment associated with calcineurin inhibitors.     I discussed the requirement for long term followup with the transplant program, but emphasized the long-term diminution of  immunosuppressive therapy as well as the requirement for less frequent visits to the transplant center as time progressed.     Finally I discussed the minimal hospital stay for liver transplant being approximately 10-14 days but that complications frequently occur with liver transplantation and some patience require prolonged hospitalization. We also reviewed the chronic deconditioning associated with long-term organ failure in the necessity to rehabilitate much of the preoperative general dysfunction. I specifically reminded the patient that the minimum time to returning to work or usuall activity is at least 3 months, but significantly deconditioned patients will often not return to full function for 6-9 months.                I saw and evaluated the patient. I agree with the resident's/fellow's findings and plan of care as documented above.    Buel Ream, MD

## 2018-07-28 ENCOUNTER — Telehealth: Payer: Self-pay

## 2018-07-28 ENCOUNTER — Encounter: Payer: Self-pay | Admitting: Cardiology

## 2018-07-28 DIAGNOSIS — K7460 Unspecified cirrhosis of liver: Secondary | ICD-10-CM | POA: Insufficient documentation

## 2018-07-28 DIAGNOSIS — I2510 Atherosclerotic heart disease of native coronary artery without angina pectoris: Secondary | ICD-10-CM | POA: Insufficient documentation

## 2018-07-28 DIAGNOSIS — I10 Essential (primary) hypertension: Secondary | ICD-10-CM | POA: Insufficient documentation

## 2018-07-28 DIAGNOSIS — R188 Other ascites: Secondary | ICD-10-CM | POA: Insufficient documentation

## 2018-07-28 DIAGNOSIS — E119 Type 2 diabetes mellitus without complications: Secondary | ICD-10-CM | POA: Insufficient documentation

## 2018-07-28 DIAGNOSIS — E785 Hyperlipidemia, unspecified: Secondary | ICD-10-CM | POA: Insufficient documentation

## 2018-07-28 NOTE — Progress Notes (Signed)
Physical Therapy Daily Flowsheet:  *Please see Physical Therapy Exercise Flowsheet for details regarding exercises completed this session.*     07/27/18 1600   Overview   Diagnosis frailty   Insurance Medicare   Script Date 06/23/18   Visit # 1   Time Calculation   PT Timed Codes 0   PT Untimed Codes 45   PT Total Treatment 45     Jani Files PT, DPT

## 2018-07-28 NOTE — Progress Notes (Signed)
Dear Dr. Alford Camilla, Elmon Else, FNP:    Today, 07/28/2018, I had the pleasure of seeing Cheyenne Butler in my cardiology clinic for evaluation.    CHIEF COMPLAINTS:   Chief Complaint   Patient presents with    New Patient Visit     Liver cirrhosis. Being considered for liver transplant.       HISTORY OF PRESENT ILLNESS: Cheyenne Butler is 72 years old and has past medical history significant for coronary artery disease, non-ST elevation myocardial infarction January 2018, coronary angioplasty January 2018 ([place], [place]), diabetes, hypertension, hyperlipidemia, liver cirrhosis (complicated by ascites, esophageal varices, GI bleed requiring banding). She is being considered for liver transplant and has been sent to my office for evaluation and for preop assessment of perioperative risk of an adverse cardiac event. She has a cardiologist and [place], [place] and is regularly seen in his office. After her myocardial infarction in January 2018, she has done quite well on medical therapy and has not had any recurrent coronary events or symptoms of angina. As part of liver transplant workup, she had a dobutamine stress echo on 07/26/18. She was found to have normal LV ejection fraction with no significant regional wall motion abnormalities and mild diastolic dysfunction. Mild mitral valve regurgitation was also noted. No evidence of dobutamine stress induced myocardial ischemia was noted.    CURRENT ALLERGIES:  Allergies   Allergen Reactions    Sulfa Antibiotics Hives       CURRENT MEDICATIONS:   Current Outpatient Medications   Medication Sig    polyethylene glycol (GLYCOLAX,MIRALAX) powder packet Take by mouth daily    lactulose (CEPHULAC) 20 g packet Take 20 g by mouth 4 times daily    furosemide (LASIX) 20 MG tablet Take 20 mg by mouth every morning    Albumin human 25% 25 % IV Administer 200 mLs (50 g total) into the vein once a week   To obtain a serum albumin level of 3.5 for 2 consecutive weeks then  discontinue.    insulin glargine (LANTUS) 100 UNIT/ML injection vial Inject 45 Units into the skin nightly       oxybutynin (DITROPAN) 5 MG tablet Take 5 mg by mouth 3 times daily    pantoprazole (PROTONIX) 40 MG EC tablet Take 40 mg by mouth 2 times daily   Swallow whole. Do not crush, break, or chew.     gabapentin (NEURONTIN) 400 MG capsule Take 400 mg by mouth 2 times daily     atorvastatin (LIPITOR) 40 MG tablet Take 40 mg by mouth daily    spironolactone (ALDACTONE) 50 MG tablet Take 25 mg by mouth daily     rifAXIMin (XIFAXAN) 550 MG tablet Take 550 mg by mouth 2 times daily    metoprolol (TOPROL-XL) 25 MG 24 hr tablet Take 50 mg by mouth daily Do not crush or chew. May be divided.     nitroglycerin (NITROSTAT) 0.4 MG SL tablet Place 0.4 mg under the tongue every 5 minutes as needed for Chest pain   May repeat 2 times then call 911 if pain persists.       PAST MEDICAL HISTORY:  Past Medical History:   Diagnosis Date    Angina pectoris     Bladder prolapse, female, acquired     CAD (coronary artery disease)     Jan 2018 - stents placed (2)    Cirrhosis of liver with ascites, unspecified hepatic cirrhosis type     DM (diabetes mellitus), type 2  Dyslipidemia     Esophageal reflux     Grade II diastolic dysfunction     Hemorrhoid     High blood pressure     History of blood transfusion     Hypertension     Ischemic heart disease     Urinary incontinence        SOCIAL HISTORY: The patient  reports that she has never smoked. She has never used smokeless tobacco. She reports that she does not drink alcohol or use drugs.    FAMILY HISTORY: family history includes Colon cancer in her brother.    REVIEW OF SYSTEMS: Neuro: denies headaches but has noted occasional dizziness. Constitutional: denies fever or chills; denies weight loss or weight gain; complains of tiredness. Eyes: negative. ENT: negative. Cardiovascular: denies chest pain, orthopnea or paroxysmal nocturnal dyspnea. Respiratory:  denies shortness of breath or cough. GI: denies nausea, vomiting or diarrhea. GU: negative. Musculoskeletal: denies pain in any joints. Skin: denies skin rashes. Psychiatric: negative. Endocrine: negative. Hematologic: negative. Allergy / Immunology: negative.    PHYSICAL EXAMINATION:   Cheyenne Butler appears to be a somewhat sick looking woman who is sitting comfortably.   Vitals signs are:   Vitals:    07/27/18 1534   BP: 118/61   Pulse: 60   Weight: 65 kg (143 lb 6.4 oz)   Height: 1.575 m (5\' 2" )     BMI: Body mass index is 26.23 kg/m.  Eyes: show normal conjunctivae and lids with no major abnormalities  ENT exam: There is normal oral mucosa without pallor or cyanosis.  Neck examination: normal JVP and normal thyroid gland.  Lung examination: shows normal respiratory effort. Lung fields are clear with no obvious wheezes and crackles.   Cardiovascular examination: S1 and S2 are normal. Soft holosystolic murmur of mitral valve regurgitation is noted. Carotid artery upstroke is normal and no obvious carotid bruits are noted. Femoral artery pulses as well as dorsalis pedis / posterior tibial pulses are normal.  Abdomen: is soft and there appears to be only mild ascites. No obvious palpable masses are noted.  Musculoskeletal examination: shows normal back without scoliosis. Gait is normal. Muscle strength and tone are normal. There is no evidence of significant arthritis in other joints either.  Extremities examination: shows normal digits and nails. There is no significant lower extremity edema.  Skin and subcutaneous tissue examination: appears to be normal.  Neurological / Psychiatric examination: shows intact cranial nerves. Muscle strength and tone is normal. Reflexes are symmetrical. Co-ordination is intact. Orientation, mood and affect appear to be normal.    ECG: Sinus bradycardia with heart rate 57 bpm. Right bundle branch block is noted with secondary repolarization abnormalities.     Assessment    IMPRESSION AND  RECOMMENDATIONS: Cheyenne Butler is 72 years old and has past medical history significant for coronary artery disease, non-ST elevation myocardial infarction February 2018, coronary angioplasty (RCA; placement of 2 drug-eluting stents) 11/11/16 ([place], [place]), diabetes, hypertension, hyperlipidemia, liver cirrhosis (complicated by ascites, esophageal varices, GI bleed requiring banding). She is being considered for liver transplant and has been sent to my office for evaluation and for preop assessment of perioperative risk of an adverse cardiac event. She feels well and denies any complaints. More specifically, she denies any chest discomfort or significant shortness of breath. She is regularly seen by a cardiologist in [place], [place]. She had a dobutamine stress echo on 07/26/18 which, showed normal LV systolic function at rest, mild diastolic dysfunction, mild mitral valve  regurgitation and no evidence of myocardial ischemia on dobutamine stress.     Following problems were addressed:     1. Coronary artery disease: Patient has a history of coronary artery disease. She denies any anginal symptoms. Recent dobutamine stress echo showed no evidence of myocardial ischemia. She does not need any further cardiac workup prior to consideration for liver transplant. There are no cardiac contraindications for her to be on liver transplant list. Current medical therapy for coronary artery disease will be continued.    2. Hypertension: Blood pressure appears to be under excellent control. No changes are necessary in her antihypertensive therapy.    3. Diabetes: Patient will continue management of diabetes under guidance from your office. Aggressive control of diabetes is very important to prevent progression of coronary artery disease.    4. Hyperlipidemia: Patient is on atorvastatin 40 mg every day. Long-term statin use is needed given her history of coronary artery disease and coronary angioplasty.    5. Preop  assessment of perioperative risk of an adverse cardiac event: Patient had a dobutamine stress echo recently which, showed no evidence of myocardial ischemia. She does not need any more cardiac workup. She can be placed on liver transplant list. She does not have any cardiac contraindications to be on liver transplant list.    Thank you for allowing me to participate in care of this pleasant patient. Please do not hesitate to contact me if you have any questions.  Kathrynn Speed, MD     Daralene Milch, MD   [facility]  Pager [phone]. Phone [phone]. Fax [phone].

## 2018-07-28 NOTE — Telephone Encounter (Signed)
Per pt/family, Dr Consuello Masse has not been getting any records from [place].  I called his office and confirmed the fax number we have is correct.  Once clinic notes are completed from yesterday can you please fax his office both clinic notes and all testing results?

## 2018-07-29 LAB — MITOCHONDRIAL ANTIBODIES, M2: Mitochondrial Ab: 5.8 Units (ref 0.0–20.0)

## 2018-07-29 LAB — F-ACTIN ANTIBODY: F-Actin IgG: 15 Units (ref 0–19)

## 2018-07-29 LAB — TB AG T-CELL STIMULATION: TB Ag T-Cell Stimulation: 0

## 2018-08-01 NOTE — Progress Notes (Signed)
Transplant Psychosocial Assessment     Communication style/ Mental status: alert and oriented x 3, pleasant and cooperative, good eye contact, well groomed and recall good   Primary language: [place] Interpreter required?  No     Adherence: no known adherence challenges   Support Plan and Childcare Plan after Transplant:  Daughter Raquel Sarna, son Jason Fila, [place]   Other Current Stressors: None   Advance Directive Status: HCP & POA advised.     Summary:   Mrs. Grecco is a pleasant 72yo married female presenting for liver transplant evaluation.  She is accompanied by her son Jason Fila and daughter-in-law Sharyn Lull, who she identifies as her primary supports.  Transplant Social Worker met with patient on today's date to complete transplant psychosocial evaluation.  Writer identified self and role with the transplant team, answered questions related to transplant evaluation/listing process.  Writer advised contents of social work evaluation will be used in determining if psychosocial criteria met for transplant consideration.      Patient lives in [place], approximately 4.5 hours away from [facility].  She rents a two story home, where she lives her husband of 51 years 70yo Jason Fila and their three daughters Juliann Pulse (50yo), Sarah (43yo), and Raquel Sarna [phone]).  Her husband requires assistance with ADLs, which patient and daughters assist with his care.  In total, patient has seven children and six living siblings.  Parents deceased.  She is independent with her ADLs, uses a rollator for ambulation.  She last drove in 2018.       Social worker educated patient on transplant process including: medication regimen and the necessity of ongoing immunosuppression therapy, the importance of adherence, post-operative follow-up care as outpatient, social support requirements, available resources, and financial considerations.  Both patient and support aware and agreeable to rigors of pre- and post- transplant care.  Patient appears to have an adequate  support system for post-transplant care with her daughter Raquel Sarna (caretaker), son Jason Fila (works full-time, access to [employer]), DIL Sharyn Lull Engineer, materials, access to [employer]), along with other local daughters identified as supports.      Patient has a minimal substance use history.  In terms of alcohol, patient reports last use was over five years ago.  She admits to infrequent social use.  She denies any history of tobacco or drug use, as well as impact from substance use.  Writer educated patient on transplant program policy of total abstinence from drugs and alcohol in both the pre- and post- setting.  SW reviewed random tox screen policy with 8 hour window for completion.      Mrs. Vanhoose denies any mental health issues/concerns.  She reports good adherence with medical treatment, attending appointments as scheduled, taking medications as prescribed, and completing necessary labwork in timely fashion.  Her daughter Raquel Sarna helps with medication management, filling patient's pillbox weekly.  Insurance intact with Medicare and Medicaid.  Patient provided information on CDPAS program.  She is connected with [employer] for mileage reimbursement already.  Family utilizing [employer] for this stay.      Patient currently meets psychosocial criteria for transplant listing.       Please see Doc Flowsheet pasted below for further detail.      Plan:   Social worker to provide supportive services as needed.  Patients psychosocial evaluation will be presented to the Transplant Selection Committee, when/where a formal team decision will be made regarding the patients overall transplant candidacy.     Social Worker: Colon Flattery, LMSW  07/26/18 1000   TRANSPLANT TYPE   Organ Type Liver   CONTACT(S)   Name Cynthis Purington   Number [phone]   Relationship DIL   Additional Contact? Y   Additional Contact Name Livi Mcgann. - son  [phone])   DEMOGRAPHICS   Religious/Cultural Factors None    County of Residence Other (specify in Comments)  ([place])   Marital Status Married  (70yo Jason Fila - at home (health issues), 53 yrs married )   [place] Citizen Y   Ethnicity/Race White   White White:  Not Specified/Unknown   EDUCATION/TEACHING NEEDS   Education level Other (specify in Comments)  (completed 9th grade)   Learning accomodations Visual impairment;Other language (specify in Comments)  (bifocals)   INCOME/INSURANCE   Vocational On disability;Other (specify in Comments)  ([employer], homemaker)   Employment-Spouse/Significant Other Retired   Income situation Other (specify in [place])  (Social security & [place])   Insurance information Medicare;Medicaid   Prescription Coverage Y   Prescription Coverage Type 10 medications, uses a pillbox, dtr Raquel Sarna helps   Served in [place] [employer] N   HOUSING   Type of home 2-Story home   Home geography Steps to enter the home;Two-story;Bedroom on first floor;Bathroom on first floor;Utilities working;Other (specify in Comments)  (Rent home, [place] (4.5hrs away))   Lives with Spouse;Child;Other (specify in Comments)  (dtr Juliann Pulse (70yo), Sarah (70yo), Raquel Sarna [phone]), no pets)   Family structure Spouse;Children;Siblings;Other (specify in Comments)  (9 grandchildren, great grandchildren too)   ACTIVITIES OF DAILY LIVING   Transfers Independent   Assistive device Other (specify in Comments)  (rollator)   Ambulation Independent   Bathing/Grooming Independent   Nutrition Independent   Household management Independent   Fatigue No   Does patient currently have home care services? N   Recreation/Hobbies Other (specify in Comments)  (play cards, puzzles, word search books)   SUBSTANCE HISTORY   Substance abuse history Yes   History of Tobacco Use N   History of Alcohol Use Y   Date of Last Use - Alcohol   (last etoh use over 5 years)   Additional Comments infrequent social use   History of Drug Use N   Has patient been prescribed pain medications? N   Concerns about prescription drugs misuse,  abuse? N   Substance abuse impact Y   If yes, impact on employment? No   If yes, legal impacts (any DUIs)? N   If yes, impact on relationships? N   Chemical Dependency Rehabilitation Programs History N   Family History of Alcoholism? N   Substance abuse progress APPLICABLE   Urine screen Y;Other (specify in Comments)  (07/26/18 - neg)   [place] Issues (Patient-Reported) None reported   History of mental health counseling No   Medications No   Medications prescribed by N/A   Mental health inpatient stays No   Suicide attempts No   Self harm No   Domestic violence No   History of abuse/neglect No   Firearms in the home No   DIALYSIS HISTORY   Dialysis N   COPING/COMPLIANCE   Coping Adjusting well;Appropriately concerned;Interested in pursuing transplant   Compliance Adherent to current medical care;Willing to commit to post transplant care regimen including: need for regular lab work, ongoing clinic appointment and importance of medication adherence   SUPPORT/TRANSPORTATION   Family or support system availability Appears to have an adequate support system for post transplant care;With patient through process, emotionally supportive  Transportation for post transplant care Family/friend   RISK SUMMARY   Patient appears to have a good understanding of the risks and benefits associated with transplant Yes   Overall risk Low risk   Needs further evaluation No   Barriers to transplant None at this time   REFERRALS   Referrals made None at this time   Time Spent   Time Spent with Patient (min) 60

## 2018-08-01 NOTE — Telephone Encounter (Signed)
Records faxed as requested.

## 2018-08-02 NOTE — Progress Notes (Signed)
Assessing patient for placement on transplant waiting list  Department of Physical Medicine & Rehabilitation  Physical Therapy Initial Assessment    Subjective:  Referring practitioner: Duheme  Reason for referral: Functional Assessment Prior to Liver Transplantation: Frailty    Past Medical History:   Diagnosis Date    Angina pectoris     Bladder prolapse, female, acquired     CAD (coronary artery disease)     Jan 2018 - stents placed (2)    Cirrhosis of liver with ascites, unspecified hepatic cirrhosis type     DM (diabetes mellitus), type 2     Dyslipidemia     Esophageal reflux     Grade II diastolic dysfunction     Hemorrhoid     High blood pressure     History of blood transfusion     Hypertension     Ischemic heart disease     Urinary incontinence      Past Surgical History:   Procedure Laterality Date    CARDIAC CATHETERIZATION  10/2010    CORONARY ANGIOPLASTY WITH STENT PLACEMENT  09/2016    TUBAL LIGATION      variceal banding         Previous treatments: home PT  Previous Functional Level: independent  Type of home: multilevel; resides on 1st floor   Lives with: husband, children   Medical equipment in the home: grab bars, shower chair, raised toilet seat; uses rollator for mobility at all times  Recreational activities: coloring; watching TV  Work Status:  Not working  1 fall tripping in closet a few weeks ago; feels like balance has been improving as liver function has improved.      Pain: denies    Current functional limitations:walking long distances, balance    OBJECTIVE:  Observation: Patient is a pleasant and cooperative female in NAD.  Cognition: no deficit noted    ROM:       R UE: AROM within functional limits   L UE: AROM within functional limits   R LE: AROM within functional limits   L LE: AROM within functional limits    *Indicates pain    Strength:    Shoulder Right Left   Flexion 4+ 4+   Extension NT NT   Abduction 4+ 4+   Adduction NT NT   Internal Rotation NT NT    External Rotation NT NT   Elbow     Flexion 5 5   Extension 5 5   Wrist     Flexion 5 5   Extension 5 5   Grip Strength 17 14   * = pain      HIP Right Left   Flexion 4 4   Extension NT NT   Abduction 4+ 4+   Adduction 5 5   KNEE     Flexion 4+ 4+   Extension 4 4   ANKLE     Dorsiflexion 5 5   Plantarflexion NT NT   * = pain    Balance:   Wide BOS/Eyes Open: 30 sec nil sway   Wide BOS/Eyes Closed: 30 sec min sway   Narrow Base/Eyes Open: 30 sec nil sway   Narrow Base/ Eyes Closed: 30 sec min sway   Sharpened Romberg: <15 sec LOB   SLS: not tested    Functional Mobility:     Bed Mobility: independent     Transfers: uses UEs to rise from chair     Gait: decreased cadence, wide BOS, increased  M-L sway, independent with rollator      Stairs: not tested      FUNCTIONAL OUTCOME MEAUSRES:      Frailty Test:   15 foot walk test: ?7 sec for ht ? 173 cm (68 in) (Men)     ?7 sec for ht ? 159 cm (63 in) (Women)     ?6 sec for ht > 173 cm (68 in) (Men)     ?6 sec for ht > 159 cm (63 in)  (Women)   Trial 1: 5.68   Trial 2: 5.9    Grip strength: ?29 kg for BMI ?24                         ?17 kg for BMI ?23    ?30 kg for BMI 24.1-26                   ?17.3 kg for BMI 23.1-26    ?30 kg for BMI 26.1-28                   ?18 kg for BMI 26.1-29    ?32 kg for BMI >28                          ?21 kg for BMI >29                        Right: 17   Left: 14    6 Minute Walk test: 1115 ft  Mean Distance in Feet by Age and Gender   Age Female Female   28-69 1877 ft 76 ft   70-79 1729 ft 1545 ft   80-89 1368 ft 42 ft       Endurance: fair     Characteristic Operational Measure Meet Criteria (yes/no)   Slowness  (15 foot walk test) Trial 1: 5.68  Trial 2: 5.9 No   Weakness  (Grip Strength) Trial 1: R: 17  Trial 2: L: 14 Yes       ASSESSMENT:  Cheyenne Butler is a 72 y.o. female with a history of Diagnosis: frailty. She currently presents to outpatient physical therapy for an pre-liver transplant evaluation to help determine qualification  for liver transplant and to determine if skilled physical therapy is appropriate at this time. Patient demonstrated general deconditioning with impaired strength, endurance, and balance. Patient met criteria for frailty in weakness but not slowness. Patient would benefit from skilled therapy back home minimize deficits and improve function.    The following comorbidities may affect treatment/recovery: Liver disease and the following Personal factors may affect treatment/recovery: History of falls  Recent hospital admission  Needs assistance for mobility  frequent hospital admission.       Clinical presentation:evolving    Patient complexity is moderate level as indicated by above personal factors, environmental factors and comorbidities in addition to their impairments found on physical exam.      Rehab potential/prognosis: fair  Patient's understanding: good      Short term goals: 1 visit  1. Assess patient for placement on transplant list  2. Safe ambulation          PLAN  Plan of Care: PT back home         Recommend Physical Therapy closer to home   Recommended equipment: rollator   Patient assessed for placement on transplant waiting list  Thank you for the referral.  If you have any questions and/or concerns, please feel free to contact me at [phone].      Merleen Milliner PT, DPT

## 2018-08-03 ENCOUNTER — Telehealth: Payer: Self-pay

## 2018-08-03 NOTE — Telephone Encounter (Signed)
Called and spoke to pt regarding outcome of selection committee last week:    1.Team in agreement that liver transplant would not offer pt a better quality of life or survival benefit.  MELD 12 with symptoms relatively well controlled on medications.  2. Abnormal chest CT findings-will reach out to PCP to follow up locally with a pulmonologist-attempted to call but office closed at 3pm.  Will fax chest xray results with request for local pulmonary consult to fax number provided  3. We will contact pt with a 3 month fuv in [place] with Dr Alois Cliche

## 2018-08-04 ENCOUNTER — Telehealth: Payer: Self-pay

## 2018-08-04 NOTE — Telephone Encounter (Signed)
Cheyenne Butler, VM for home number is full, called alternate number, left msg to call back and schd.

## 2018-08-04 NOTE — Telephone Encounter (Signed)
Called PCP office and LVMM on nurses line.  Discussed we had a mutual pt whom we saw for transplant evaluation last week.  As part of work up we did a chest CT which came back with significant abnormal findings referring to scarring and possible hx of aspiration.  Pt denies any lung issues.  I faxed report yesterday and wanted to make sure they received the report and can refer pt locally to a pulmonologist.  Pt is aware.  We also did not put pt forward for liver transplant as she is somewhat frail and her symptoms of cirrhosis are pretty well controlled on medication.  Will await call back

## 2018-08-15 ENCOUNTER — Telehealth: Payer: Self-pay

## 2018-08-15 NOTE — Telephone Encounter (Signed)
08/15/18 handoff Cheyenne BilletMike Grizzanti NP    FUV: 10/11/2018 [facility]    1.Team in agreement that liver transplant would not offer pt a better quality of life or survival benefit. MELD 12 with symptoms relatively well controlled on medications.  2. Abnormal chest CT findings-will reach out to PCP to follow up locally with a pulmonologist-attempted to call but office closed at 3pm. Will fax chest xray results with request for local pulmonary consult to fax number provided  3. We will contact pt with a 3 month [place] in [place] with Dr [name]    Diagnosis: NASH    MELD:12  Blood group: A    Follow-Up:  3 months[name]  List: Not a Candidate    Imaging:  CT/MRI: 07/26/18   Chest CT: 07/26/18   DSE:07/26/18     TBAg:07/26/18  Colon: 03/24/18  Mammo:needs  ZOX:[id number]:needs  Dental:full dentures    Social Supports: Daughter, son Kerby Noraercy, and DIL    ETOH/Substance Abuse: NA    [facility]  Phone - [phone]  Fax -

## 2018-08-22 ENCOUNTER — Other Ambulatory Visit: Payer: Self-pay

## 2018-08-22 NOTE — Progress Notes (Signed)
Transplant Pharmacy Listing Meeting Note:    Medication list in eRecord reviewed. No pharmacologic contraindication to listing identified.    Patient is a candidate for methylprednisolone induction therapy given current renal function. This will need to be reviewed at the time of transplant.     Cheyenne Butler A Veva Grimley, PharmD, [phone]  Clinical Pharmacist, Abdominal Transplant

## 2018-09-25 LAB — EKG 12-LEAD
P: -5 deg
PR: 176 ms
QRS: 66 deg
QRSD: 124 ms
QT: 428 ms
QTc: 417 ms
Rate: 57 {beats}/min
T: 163 deg

## 2018-09-26 ENCOUNTER — Telehealth: Payer: Self-pay

## 2018-09-26 NOTE — Telephone Encounter (Signed)
Patient's mother called patient has an appointment 1/14 and needs labs drawn prior to appointment [facility] fax # [phone]

## 2018-09-27 ENCOUNTER — Other Ambulatory Visit: Payer: Self-pay | Admitting: Transplant

## 2018-09-27 DIAGNOSIS — R748 Abnormal levels of other serum enzymes: Secondary | ICD-10-CM

## 2018-09-29 NOTE — Telephone Encounter (Signed)
Faxed labs to to [facility] # [phone]

## 2018-10-05 ENCOUNTER — Encounter: Payer: Self-pay | Admitting: Gastroenterology

## 2018-10-06 ENCOUNTER — Telehealth: Payer: Self-pay

## 2018-10-06 NOTE — Telephone Encounter (Signed)
Daughter called and wanted labs faxed to [place] in infusion [phone]

## 2018-10-11 ENCOUNTER — Ambulatory Visit: Payer: Medicare Other | Attending: Gastroenterology | Admitting: Gastroenterology

## 2018-10-11 ENCOUNTER — Encounter: Payer: Self-pay | Admitting: Gastroenterology

## 2018-10-11 VITALS — BP 121/78 | HR 68

## 2018-10-11 DIAGNOSIS — K7460 Unspecified cirrhosis of liver: Secondary | ICD-10-CM

## 2018-10-11 DIAGNOSIS — I5189 Other ill-defined heart diseases: Secondary | ICD-10-CM

## 2018-10-11 DIAGNOSIS — I259 Chronic ischemic heart disease, unspecified: Secondary | ICD-10-CM

## 2018-10-11 DIAGNOSIS — K760 Fatty (change of) liver, not elsewhere classified: Secondary | ICD-10-CM

## 2018-10-11 DIAGNOSIS — R188 Other ascites: Secondary | ICD-10-CM

## 2018-10-12 ENCOUNTER — Encounter: Payer: Self-pay | Admitting: Gastroenterology

## 2018-10-12 NOTE — Progress Notes (Signed)
[facility]plant Hepatology [facility]    Referring Physician: Dr. Kimberlee Nearing  Primary Care Physician: [name], FNP  Primary Gastroenterologist: Dion Body, DO    We had the pleasure of seeing  Cheyenne Butler in hepatology clinic in follow up. As you know, she is a 73 y.o. 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  She was evaluated for OLT but felt to be best treated conservatively as OLT would likely not improve her overall situation.    History of Present Illness:  72 y.o. woman with history of NASH/ETOH cirrhosis however patient denies heavy ETOH use in the past. Complicated by ascites, GIB with banding of esophageal varices, and hepatic encephalopathy requiring multiple admission. Her cirrhosis was first diagnosed a few years ago.     Today the patient is doing very well,  She reports reduced baseline chronic fatigue and improved physical activity. Denies jaundice, icterus pruritis. Reports history of abdominal distention or ascites. Her last paracentesis was done 6 months ago and that was her first and last one. She reports intermittent lower extremity edema.   She is on spironolactone 59m daily and furosemide 261mdaily, and weekly infusions of albumin. Reports marked improvement after started on albumin. She is following a low sodium diet. Denies abdominal pain. Denies nausea, vomiting, fever, chills. Denies easy bruising, GI bleeding.  The patient and the family report frequent episodes of forgetfulness but deny confusion. She sleeps well. She is on lactulose and rifaximin, moving bowels 4-6 BMs daily. She will do miralax if is falling behind in bowel movements.     MELD-Na score: 12 at 07/26/2018 10:50 AM  MELD score: 12 at 07/26/2018 10:50 AM  Calculated from:  Serum Creatinine: 1.45 mg/dL at 07/26/2018 10:50 AM  Serum  Sodium: 138 mmol/L (Rounded to 137 mmol/L) at 07/26/2018 10:50 AM  Total Bilirubin: 0.9 mg/dL (Rounded to 1 mg/dL) at 07/26/2018 10:50 AM  INR(ratio): 1.2 at 07/26/2018 10:50 AM  Age: [age]    Past Medical History:  Past Medical History:   Diagnosis Date    Angina pectoris     Bladder prolapse, female, acquired     CAD (coronary artery disease)     Jan 2018 - stents placed (2)    Cirrhosis of liver with ascites, unspecified hepatic cirrhosis type     DM (diabetes mellitus), type 2     Dyslipidemia     Esophageal reflux     Grade II diastolic dysfunction     Hemorrhoid     High blood pressure     History of blood transfusion     Hypertension     Ischemic heart disease     Urinary incontinence         Past Surgical History:   Past Surgical History:   Procedure Laterality Date    CARDIAC CATHETERIZATION  10/2010    CORONARY ANGIOPLASTY WITH STENT PLACEMENT  09/2016    TUBAL LIGATION      variceal banding         Social History:   Social History     Tobacco Use    Smoking status: Never Smoker    Smokeless tobacco: Never Used   Substance Use Topics    Alcohol use: No       Family History:   family history includes Colon cancer in her brother.    ROS  Vitals:   Vitals:    10/11/18 1056   BP: 121/78   Pulse: 68     There is no height or weight on file to calculate BMI.    Physical Exam:  General appearance: alert, appears stated age and cooperative, chronically ill appearing   Eyes: EOM, absence of icterus.  Ears, Nose, Mouth and throat: MMM, clear o/p without ulcers/lesions  Neck: supple, symmetrical, trachea midline   Cardiovascular: regular rate and rhythm, S1, S2 normal, no murmur, rubs, or gallop.  No JVD and extremities with no cyanosis or edema.  Pulses 2+ and symmetric   Respiratory: clear to auscultation bilaterally. No crackles or wheezing.  Gastrointestinal: abdomen soft, non-tender; +bowel sounds in all 4 quadrants; no masses, no organomegaly. She has presence of ascites,  absence of  caput medusa, absence of surgical scar, absence of hernia.   Rectal examination deferred.  Skin:She has absence of jaundice, presence of spider angiomas. No lesions.   Neurological: grossly normal, presence of mild asterixis  Extremities: extremities warm and dry. 1+ edema    Functional Status:  Karnofsky Performance Status -  Score-50- Requires considerable assistance and frequent medical care.    LAB DATA:  Laboratory Data  Lab Results   Component Value Date    NA 138 07/26/2018    K 3.9 07/26/2018    CL 97 07/26/2018    CO2 27 07/26/2018    UN 30 (H) 07/26/2018    CREAT 1.45 (H) 07/26/2018     Lab Results   Component Value Date    ALK 252 (H) 07/26/2018    TB 0.9 07/26/2018    DB 0.2 07/26/2018    ALB 4.4 07/26/2018    ALT 52 (H) 07/26/2018    AST 58 (H) 07/26/2018     Lab Results   Component Value Date    WBC 5.3 07/26/2018    HGB 10.8 (L) 07/26/2018    HCT 35 07/26/2018    MCV 94 07/26/2018    PLT 103 (L) 07/26/2018     No results found for: LIP, AMY   Lab Results   Component Value Date    INR 1.2 (H) 07/26/2018       Blood type: A positive    Assessment:  Cheyenne Butler is a 73 y.o. 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     Plan:   # EtOH Cirrhosis   - MELD-Na score: 12  - Update labs every month.   - Educated patient on importance of staying as active as possible to preserve functional status.  - Should avoid all herbal remedies and NSAIDs.   - Okay to take tylenol as needed for generalized aches/pains with a maximum daily dose of 2,067m per day.   - Educated patient that absolutely no alcohol and/or drugs that are not prescribed to patient are safe for liver.   - Would recommend that patient have pneumococcal and yearly influenza vaccine through PCP if not done already.   - Would ask that PCP  arrange bone density scan (DEXA scan) every 3 years with aggressive management of bone loss if present.    - Would ask that PCP obtain vitamin D level twice yearly and repletion if abnormal.    # Fluid retention/Ascites   - Low sodium (less than 2,0041mper day), high protein diet (1.6g/kg).   -  Patient was seen by transplant dietician yesterday during transplant evaluation and given educational handouts on low sodium/high protein diets.   - In patients with ascites, avoid non-dipyridamole CCB and ace inhibitors as they worsen fluid retention  - Home regimen includes: spironolactone 21m daily and furosemide 26mdaily, and weekly infusions of albumin.  - Hx of SBP: unknown  - Should continue to weigh self daily. Identified that patient should contact office if greater than 5lb weight gain in one week.    In patients with diuretic-sensitive ascites, the removal of 5 liters of fluid is sufficient to reduce intraabdominal pressure, at which point sodium restriction and diuretics are continued   6 to 8 g of albumin should be given per liter of fluid removed in the case of larger-volume paracentesis, >5L    # Hepatic encephalopathy  - Continue lactulose and titrate for 3-4 large bowel movements per day  - Continue rifaximin 55061mID  - Hopefully with more renal protection may see reduction in episodes.  - Educated patient on importance of eating a high protein bedtime snack to reduce overnight muscle wasting  - Medications to Avoid: Benzos, NSAIDs, sedating medications  - Opioids should be used only when absolutely necessary, at lower dose and increased interval    # HCCStreatorrveillance   - Should have abdominal imaging and AFP every 6 months.   - Last AFP: <1 (07/26/2018). Repeat due 01/2019.   - Last Ultrasound/Imaging: CT, no focal lesions (07/26/2018). Repeat due 01/2019.  - Treatment History: no    # Variceal screening.   - Last EGD on 06/30/2018: Small esophageal varices that did not require further banding. Portal  hypertensive gastropathy  - Recommend repeat in one year  - Has undergone banding in the past?: yes  - Is patient on beta blockers?: no     # Transplant Candidacy  - Too well at present, OLT would not improve her situation overall,  She and her family are comfortable with this.    Abdominal/Pelvis Imaging:   - CT/MRI: Date 07/26/2018. Impression: 1. Cirrhotic liver morphology. No suspicious liver lesion visualized. 2. Trace ascites, decreased from prior CT. 3. Spleen is at the upper limits of normal in size.  - Hepatic Vasculature: Pertinent Findings: Main portal vein is patent. Portosystemic collaterals, including small lower paraesophageal varices. History of portal vein thrombus:  Yes?.      We will see her back for clinical follow up in 6 months or sooner if medically necessary.      MarElvin SoD   Medical Director: [facility]

## 2018-10-18 DIAGNOSIS — R748 Abnormal levels of other serum enzymes: Secondary | ICD-10-CM

## 2018-10-18 LAB — AFP TUMOR MARKER: AFP (eff. 4-2010): <2.5 IU/mL

## 2018-10-18 LAB — ANTINUCLEAR ANTIBODY SCREEN: ANA Comment: AB POS

## 2018-10-18 LAB — CERULOPLASMIN: Ceruloplasmin: 11.4 mg/dL

## 2018-10-18 LAB — MITOCHONDRIAL ANTIBODIES, M2: Mitochondrial Ab: <0.1 UNITS

## 2018-10-18 LAB — ALPHA-1-ANTITRYPSIN: ALPHA 1 ANTITRYPSIN: 107 MG/DL

## 2018-10-26 LAB — COMPREHENSIVE METABOLIC PANEL
ALT: 50 U/L
AST: 60 U/L
Albumin: 3.9 g/dL
Alk Phos: 191 U/L
Anion Gap: 7
Bilirubin,Total: 0.8 mg/dL
CO2: 27 mmol/L
Calcium: 9.8 mg/dL
Chloride: 105 mmol/L
Creatinine: 1.57 mg/dL
GFR,Caucasian: 33 *
Glucose: 131 mg/dL
Lab: 39 mg/dL
Potassium: 3.9 mmol/L
Sodium: 139 mmol/L
Total Protein: 7.7 g/dL

## 2018-10-26 LAB — CBC
Hematocrit: 32.8 %
Hemoglobin: 11.1 g/dL
MCV: 85.9 fL
Platelets: 95 10*3/uL
RBC: 3.82 MIL/uL
RDW: 15.6 %
WBC: 4.3 10*3/uL

## 2018-10-26 LAB — PROTIME-INR: INR: 1.2

## 2018-11-04 NOTE — Patient Instructions (Addendum)
Thank you for allowing [place] to see you at [facility].     We will contact you to schedule follow up at [facility] in 6 months.    Please have lab work done monthly as directed.    Additional blood work needed 03/2019 you will need to obtain an AFP level.    You will be due for an US of the liver 01/2019 which can be done locally.     You will be due for an EGD 06/2019 with Dr. Alois Cliche. Alternatively you can discuss having EGD locally with you local GI team if they are willing.    We will be in contact to arrange these.    Please call us with any questions or concerns.

## 2018-11-04 NOTE — Addendum Note (Signed)
Addended by: Lennox Laity on: 11/04/2018 12:45 PM     Modules accepted: Orders

## 2018-11-07 ENCOUNTER — Telehealth: Payer: Self-pay

## 2018-11-07 NOTE — Telephone Encounter (Signed)
Called and spoke with pt's daughter,   -schd f/u appt at [facility] for 07/14 at 1000.   -Advised her to have her mom's blood work done every month. She stated to send the orders to the infusion center at [place] and speak with [place]. They will draw her during the infusion.   -Called and spoke with [place]  Phone: [phone] [phone]. She confirmed they would draw the pt. Faxed her the orders.   -Called Radiology in [place]. Shcd the ultrasound for 05/01 at 1000 NPO 8 hours. Faxed order to [phone].     Called daughter back and advised of appt time.     schd EGD for 06/29/2019 at 9:15. They will get a hotel

## 2018-11-09 ENCOUNTER — Encounter: Payer: Self-pay | Admitting: Gastroenterology

## 2018-11-10 DIAGNOSIS — K7460 Unspecified cirrhosis of liver: Secondary | ICD-10-CM

## 2018-11-10 DIAGNOSIS — K760 Fatty (change of) liver, not elsewhere classified: Secondary | ICD-10-CM

## 2018-11-10 DIAGNOSIS — R188 Other ascites: Secondary | ICD-10-CM

## 2018-11-10 LAB — CBC AND DIFFERENTIAL
Baso # K/uL: 0.3 10*3/uL
Basophil %: 1 %
Eos # K/uL: 0.2 10*3/uL
Eosinophil %: 4 %
Hematocrit: 34.4 %
Hemoglobin: 11.7 g/dL
Lymph # K/uL: 0.8 10*3/uL
Lymphocyte %: 17 %
MCV: 87.1 fL
Mono # K/uL: 0.4 10*3/uL
Monocyte %: 9 %
Neut # K/uL: 3.5 10*3/uL
Platelets: 77 10*3/uL
RBC: 3.95 MIL/uL
RDW: 14.4 %
Seg Neut %: 70 %
WBC: 5 10*3/uL

## 2018-11-10 LAB — COMPREHENSIVE METABOLIC PANEL
ALT: 38 U/L
AST: 44 U/L
Albumin: 4 g/dL
Alk Phos: 181 U/L
Anion Gap: 6
Bilirubin,Total: 0.9 mg/dL
CO2: 28 mmol/L
Calcium: 9.3 mg/dL
Chloride: 103 mmol/L
Creatinine: 1.89 mg/dL
GFR,Caucasian: 26 *
Glucose: 179 mg/dL
Lab: 34 mg/dL
Potassium: 3.8 mmol/L
Sodium: 137 mmol/L
Total Protein: 7.7 g/dL

## 2018-11-10 LAB — PROTIME-INR: INR: 1.29

## 2018-11-12 NOTE — Result Encounter Note (Signed)
Labs Reviewed Stable. MELD-Na score: 16 at 11/09/2018  8:30 AM  MELD score: 16 at 11/09/2018  8:30 AM  Calculated from:  Serum Creatinine: 1.89 mg/dL at 0/04/6760  9:50 AM  Serum Sodium: 137 mmol/L at 11/09/2018  8:30 AM  Total Bilirubin: 0.9 mg/dL (Rounded to 1 mg/dL) at 9/32/6712  4:58 AM  INR(ratio): 1.29 at 11/09/2018  8:30 AM  Age: 73 years

## 2018-12-07 ENCOUNTER — Encounter: Payer: Self-pay | Admitting: Gastroenterology

## 2018-12-07 DIAGNOSIS — R188 Other ascites: Secondary | ICD-10-CM

## 2018-12-07 DIAGNOSIS — K760 Fatty (change of) liver, not elsewhere classified: Secondary | ICD-10-CM

## 2018-12-07 DIAGNOSIS — K7460 Unspecified cirrhosis of liver: Secondary | ICD-10-CM

## 2018-12-07 LAB — CBC AND DIFFERENTIAL
Baso # K/uL: 0.04 10*3/uL
Basophil %: 1 %
Eos # K/uL: 0.2 10*3/uL
Eosinophil %: 5 %
Hematocrit: 32.8 %
Hemoglobin: 11.3 g/dL
Lymph # K/uL: 0.7 10*3/uL
Lymphocyte %: 16 %
MCV: 86.5 fL
Mono # K/uL: 0.4 10*3/uL
Monocyte %: 10 %
Neut # K/uL: 3 10*3/uL
Platelets: 77 10*3/uL
RBC: 3.79 MIL/uL
RDW: 14.2 %
Seg Neut %: 69 %
WBC: 4.4 10*3/uL

## 2018-12-09 DIAGNOSIS — K7460 Unspecified cirrhosis of liver: Secondary | ICD-10-CM

## 2018-12-09 DIAGNOSIS — K760 Fatty (change of) liver, not elsewhere classified: Secondary | ICD-10-CM

## 2018-12-09 DIAGNOSIS — R188 Other ascites: Secondary | ICD-10-CM

## 2018-12-09 LAB — COMPREHENSIVE METABOLIC PANEL
ALT: 30 U/L
AST: 33 U/L
Albumin: 3.8 g/dL
Alk Phos: 159 U/L
Bilirubin,Total: 1 mg/dL
CO2: 27 mmol/L
Calcium: 9.1 mg/dL
Chloride: 104 mmol/L
Creatinine: 1.5 mg/dL
GFR,Caucasian: 34 *
Glucose: 178 mg/dL
Lab: 36 mg/dL
Potassium: 3.6 mmol/L
Sodium: 139 mmol/L
Total Protein: 7.7 g/dL

## 2018-12-09 LAB — PROTIME-INR: INR: 1.27

## 2018-12-09 LAB — AFP TUMOR MARKER: AFP (eff. 4-2010): <2.5 IU/mL

## 2018-12-09 NOTE — Result Encounter Note (Signed)
CBC reviewed - please call for remainder of labs

## 2018-12-11 NOTE — Result Encounter Note (Signed)
Labs reviewed - no changes

## 2018-12-28 ENCOUNTER — Encounter: Payer: Self-pay | Admitting: Gastroenterology

## 2019-01-11 DIAGNOSIS — R188 Other ascites: Secondary | ICD-10-CM

## 2019-01-11 DIAGNOSIS — K760 Fatty (change of) liver, not elsewhere classified: Secondary | ICD-10-CM

## 2019-01-11 DIAGNOSIS — K7460 Unspecified cirrhosis of liver: Secondary | ICD-10-CM

## 2019-01-11 LAB — PROTIME-INR
AFP (eff. 4-2010): <2.5 IU/mL
INR: 1.2

## 2019-01-11 NOTE — Result Encounter Note (Signed)
Labs reviewed- no changes.  Please call for remainder of labs

## 2019-01-12 LAB — CBC AND DIFFERENTIAL
Hematocrit: 33.8 %
Hemoglobin: 11.5 g/dL
MCV: 88 fL
Platelets: 86 10*3/uL
RBC: 3.84 MIL/uL
RDW: 14 %
WBC: 5.1 10*3/uL

## 2019-01-12 LAB — COMPREHENSIVE METABOLIC PANEL
ALT: 42 U/L
AST: 44 U/L
Albumin: 3.5 g/dL
Alk Phos: 81 U/L
Anion Gap: 4
Bilirubin,Total: 0.7 mg/dL
CO2: 30 mmol/L
Calcium: 9.3 mg/dL
Chloride: 102 mmol/L
Creatinine: 1.5 mg/dL
GFR,Caucasian: 34 *
Glucose: 136 mg/dL
Lab: 32 mg/dL
Potassium: 4.1 mmol/L
Sodium: 136 mmol/L
Total Protein: 7.8 g/dL

## 2019-01-25 ENCOUNTER — Encounter: Payer: Self-pay | Admitting: Gastroenterology

## 2019-01-25 DIAGNOSIS — K760 Fatty (change of) liver, not elsewhere classified: Secondary | ICD-10-CM

## 2019-01-25 DIAGNOSIS — R188 Other ascites: Secondary | ICD-10-CM

## 2019-01-25 DIAGNOSIS — K7460 Unspecified cirrhosis of liver: Secondary | ICD-10-CM

## 2019-01-25 LAB — CBC
Hematocrit: 33.1 %
Hemoglobin: 11.4 g/dL
MCV: 87.1 fL
Platelets: 84 10*3/uL
RBC: 3.8 MIL/uL
RDW: 14.2 %
WBC: 5.6 10*3/uL

## 2019-01-26 NOTE — Result Encounter Note (Signed)
CBC reviewed - remainder of labs pending

## 2019-01-27 ENCOUNTER — Other Ambulatory Visit: Payer: Self-pay | Admitting: Gastroenterology

## 2019-01-30 LAB — PROTIME-INR
AFP (eff. 4-2010): <2.5 IU/mL
K/L ISH Ratio: 3.01
Kappa Free Lt Chains: 16.5
LAMBDA Free Light Chains: 5.48 mg/dL

## 2019-03-01 ENCOUNTER — Encounter: Payer: Self-pay | Admitting: Gastroenterology

## 2019-03-01 DIAGNOSIS — K7460 Unspecified cirrhosis of liver: Secondary | ICD-10-CM

## 2019-03-01 DIAGNOSIS — K760 Fatty (change of) liver, not elsewhere classified: Secondary | ICD-10-CM

## 2019-03-01 DIAGNOSIS — R188 Other ascites: Secondary | ICD-10-CM

## 2019-03-01 LAB — COMPREHENSIVE METABOLIC PANEL
ALT: 37 U/L
AST: 40 U/L
Albumin: 3.7 g/dL
Alk Phos: 140 U/L
Anion Gap: 4
Bilirubin,Total: 1 mg/dL
CO2: 30 mmol/L
Calcium: 9 mg/dL
Chloride: 104 mmol/L
Creatinine: 1.52 mg/dL
GFR,Caucasian: 34 *
Glucose: 136 mg/dL
Lab: 29 mg/dL
Potassium: 4.1 mmol/L
Sodium: 138 mmol/L
Total Protein: 7.7 g/dL

## 2019-03-01 LAB — CBC AND DIFFERENTIAL
Baso # K/uL: 0.1 10*3/uL
Basophil %: 1 %
Eos # K/uL: 0.2 10*3/uL
Eosinophil %: 3 %
Hematocrit: 33.1 %
Hemoglobin: 11.2 g/dL
Lymph # K/uL: 0.9 10*3/uL
Lymphocyte %: 14 %
MCV: 86 fL
Mono # K/uL: 0.4 10*3/uL
Monocyte %: 8 %
Neut # K/uL: 4.4 10*3/uL
Platelets: 80 10*3/uL
RBC: 3.85 MIL/uL
RDW: 14.8 %
Seg Neut %: 74 %
WBC: 5.9 10*3/uL

## 2019-03-01 LAB — PROTIME-INR
AFP (eff. 4-2010): <2.5 IU/mL
INR: 1.24

## 2019-03-01 NOTE — Result Encounter Note (Signed)
Labs Reviewed Stable. MELD-Na score: 12 at 03/01/2019  8:25 AM  MELD score: 12 at 03/01/2019  8:25 AM  Calculated from:  Serum Creatinine: 1.52 mg/dL at 5/0/0370  4:88 AM  Serum Sodium: 138 mmol/L (Rounded to 137 mmol/L) at 03/01/2019  8:25 AM  Total Bilirubin: 1.0 mg/dL at 05/06/1693  5:03 AM  INR(ratio): 1.24 at 03/01/2019  8:25 AM  Age: 72 years

## 2019-03-24 ENCOUNTER — Encounter: Payer: Self-pay | Admitting: Gastroenterology

## 2019-03-24 DIAGNOSIS — K760 Fatty (change of) liver, not elsewhere classified: Secondary | ICD-10-CM

## 2019-03-24 DIAGNOSIS — K7460 Unspecified cirrhosis of liver: Secondary | ICD-10-CM

## 2019-03-24 DIAGNOSIS — R188 Other ascites: Secondary | ICD-10-CM

## 2019-03-24 LAB — CBC AND DIFFERENTIAL
Baso # K/uL: 0 10*3/uL
Basophil %: 1 %
Eos # K/uL: 0.2 10*3/uL
Eosinophil %: 4 %
Hematocrit: 32.7 %
Hemoglobin: 11.4 g/dL
Lymph # K/uL: 0.8 10*3/uL
Lymphocyte %: 19 %
MCV: 84.9 fL
Mono # K/uL: 0.4 10*3/uL
Monocyte %: 9 %
Neut # K/uL: 2.8 10*3/uL
Platelets: 77 10*3/uL
RBC: 3.85 MIL/uL
RDW: 14.7 %
Seg Neut %: 66 %
WBC: 4.3 10*3/uL

## 2019-03-24 LAB — COMPREHENSIVE METABOLIC PANEL
ALT: 30 U/L
AST: 34 U/L
Albumin: 3.5 g/dL
Alk Phos: 136 U/L
Anion Gap: 4
Bilirubin,Total: 1 mg/dL
CO2: 28 mmol/L
Calcium: 9 mg/dL
Chloride: 107 mmol/L
Creatinine: 1.43 mg/dL
GFR,Caucasian: 36 *
Glucose: 196 mg/dL
Lab: 33 mg/dL
Potassium: 3.9 mmol/L
Sodium: 139 mmol/L
Total Protein: 7.7 g/dL

## 2019-03-24 LAB — PROTIME-INR
AFP (eff. 4-2010): <2.5 IU/mL
INR: 1.28

## 2019-03-24 NOTE — Result Encounter Note (Signed)
Labs Reviewed Stable. MELD-Na score: 13 at 03/24/2019  9:20 AM  MELD score: 13 at 03/24/2019  9:20 AM  Calculated from:  Serum Creatinine: 1.43 mg/dL at 03/24/2019  9:20 AM  Serum Sodium: 139 mmol/L (Rounded to 137 mmol/L) at 03/24/2019  9:20 AM  Total Bilirubin: 1.0 mg/dL at 03/24/2019  9:20 AM  INR(ratio): 1.28 at 03/24/2019  9:20 AM  Age: 73 years 9 months

## 2019-04-06 ENCOUNTER — Telehealth: Payer: Self-pay

## 2019-04-06 NOTE — Telephone Encounter (Signed)
Hello Cheyenne Butler ,    Thank you for scheduling your telemedicine appointment with [place]. We use Zoom, a HIPAA compliant video/audio connection to provide a secure visit.  We are excited to be able to offer this convenient way to connect with your provider.    Date: 04/11/2019  Time: 10:00am (please join the meeting 5 minutes before your scheduled appointment)  Provider: Lara Mulch   Zoom Meeting Link: [email or web address]  Zoom Meeting ID: [id number]   You will need to open the Zoom app and enter the Zoom Meeting ID to join the visit.  You can also copy and paste the [employer] Link into your [employer].    AT LEAST 1 DAY BEFORE YOUR APPOINTMENT:   Review the telemedicine consent form: Telehealth Consent (If you have any questions, or do not agree, please call the office to discuss your concerns. We will again obtain your verbal consent during the appointment)   Install Zoom on your smartphone or personal computer:  Install and Test Zoom (Once the webpage loads, click Join.  It will ask you to install Zoom if you do not have it already.  This is a test meeting to ensure you have Zoom downloaded.)    APPROXIMATELY [address] [place]:  1. Open the Zoom app  2. Select "Join A Meeting"  3. Enter the Meeting ID from above.    Please wait for your health care provider to arrive. You may receive a message that the host is in another meeting. Please stay online for at least 15 minutes into your appointment time. Your provider might be running late with another patient.    HELP IS HERE! If you are having any technical issues or need assistance setting up the Zoom application, please call [phone], available 8 a.m. to 4:30 p.m. weekdays or visit the [place] [facility] website.    For MyChart questions, call our [facility], available 8 a.m. to 4:30 p.m. weekdays: [phone], [phone] (choose Option 1).    Thank you for allowing [place] to be partners in  your care,  [facility]

## 2019-04-11 ENCOUNTER — Ambulatory Visit: Payer: Medicare Other | Admitting: Gastroenterology

## 2019-04-11 DIAGNOSIS — K7682 Hepatic encephalopathy: Secondary | ICD-10-CM

## 2019-04-11 DIAGNOSIS — I10 Essential (primary) hypertension: Secondary | ICD-10-CM

## 2019-04-11 DIAGNOSIS — K760 Fatty (change of) liver, not elsewhere classified: Secondary | ICD-10-CM

## 2019-04-11 DIAGNOSIS — K7460 Unspecified cirrhosis of liver: Secondary | ICD-10-CM

## 2019-04-11 DIAGNOSIS — R188 Other ascites: Secondary | ICD-10-CM

## 2019-04-11 DIAGNOSIS — K7290 Hepatic failure, unspecified without coma: Secondary | ICD-10-CM

## 2019-04-11 DIAGNOSIS — R748 Abnormal levels of other serum enzymes: Secondary | ICD-10-CM

## 2019-04-11 NOTE — Progress Notes (Signed)
[facility] Transplant [place]    Referring Physician: Dr. Kimberlee Nearing  Primary Care Physician: [name], FNP  Primary Gastroenterologist: Dion Body, DO    I had the pleasure of seeing  Cheyenne Butler in hepatology clinic in follow up via [place]. As you know, she is a 73 y.o. 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  She was evaluated for OLT but felt to be best treated conservatively as OLT would likely not improve her overall situation.    Interval History:    Today the patient is doing very well,  She reports reduced baseline chronic fatigue and improved physical activity. Denies jaundice, icterus pruritis.  Her last paracentesis was done 10 months ago and that was her first and last one. She reports no further intermittent lower extremity edema.     Denies abdominal pain. Denies nausea, vomiting, fever, chills. Denies easy bruising, GI bleeding.  T  he patient and the family report frequent episodes of forgetfulness and confusion. She sleeps well. She is on lactulose and rifaximin, moving bowels 4-6 BMs daily, with the addition of miralax.    She was recently diagnosed with Shingles along the left thoracic distribution without skin manifestations.    MELD-Na score: 13 at 03/24/2019  9:20 AM  MELD score: 13 at 03/24/2019  9:20 AM  Calculated from:  Serum Creatinine: 1.43 mg/dL at 03/24/2019  9:20 AM  Serum Sodium: 139 mmol/L (Rounded to 137 mmol/L) at 03/24/2019  9:20 AM  Total Bilirubin: 1.0 mg/dL at 03/24/2019  9:20 AM  INR(ratio): 1.28 at 03/24/2019  9:20 AM  Age: 37 years 9 months    Past Medical History:  Past Medical History:   Diagnosis Date    Angina pectoris     Bladder prolapse, female, acquired     CAD (coronary artery disease)     Jan 2018 - stents placed (2)    Cirrhosis of liver with  ascites, unspecified hepatic cirrhosis type     DM (diabetes mellitus), type 2     Dyslipidemia     Esophageal reflux     Grade II diastolic dysfunction     Hemorrhoid     High blood pressure     History of blood transfusion     Hypertension     Ischemic heart disease     Urinary incontinence         Past Surgical History:   Past Surgical History:   Procedure Laterality Date    CARDIAC CATHETERIZATION  10/2010    CORONARY ANGIOPLASTY WITH STENT PLACEMENT  09/2016    TUBAL LIGATION      variceal banding         Social History:   Social History     Tobacco Use    Smoking status: Never Smoker    Smokeless tobacco: Never Used   Substance Use Topics    Alcohol use: No       Family History:   family history includes Colon cancer in her brother.    Review of Systems   Constitutional: Positive for malaise/fatigue. Negative for chills, diaphoresis, fever and weight loss.   HENT: Negative.    Respiratory: Negative.    Cardiovascular: Negative.    Genitourinary: Negative.    Musculoskeletal: Negative.    Skin: Negative for itching and rash.   Neurological:  Pain along left shoulder, rib cage and breast for the last 3 weeks       Vitals:   There were no vitals filed for this visit.  There is no height or weight on file to calculate BMI.    Physical Exam: via Video and patient report    General appearance: alert, appears stated age and cooperative, chronically ill appearing   Eyes: EOM, absence of icterus.  Gastrointestinal: abdomen soft, non-tender; no evidence of ascites,    Skin:She has absence of jaundice, presence of spider angiomas. No lesions.   Neurological: grossly normal, presence of mild asterixis  Extremities: extremities warm and dry. no edema    Functional Status:  Karnofsky Performance Status -  Score-50- Requires considerable assistance and frequent medical care.    LAB DATA:  Laboratory Data  Lab Results   Component Value Date    NA 139 03/24/2019    K 3.9 03/24/2019    CL 107 03/24/2019     CO2 28 03/24/2019    UN 33 03/24/2019    CREAT 1.43 03/24/2019     Lab Results   Component Value Date    ALK 136 03/24/2019    TB 1.0 03/24/2019    DB 0.2 07/26/2018    ALB 3.5 03/24/2019    ALT 30 03/24/2019    AST 34 03/24/2019     Lab Results   Component Value Date    WBC 4.3 03/24/2019    HGB 11.4 03/24/2019    HCT 32.70 03/24/2019    MCV 84.9 03/24/2019    PLT 77 03/24/2019     No results found for: LIP, AMY   Lab Results   Component Value Date    INR 1.28 03/24/2019       Blood type: A positive    Assessment:  Cheyenne Butler is a 72 y.o. 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     Plan:   #[place]/ EtOH Cirrhosis   - MELD-Na score: 13  - Update labs every month.   - Educated patient on importance of staying as active as possible to preserve functional status.  - Should avoid all herbal remedies and NSAIDs.   - Okay to take tylenol as needed for generalized aches/pains with a maximum daily dose of 2,073m per day.   - Educated patient that absolutely no alcohol and/or drugs that are not prescribed to patient are safe for liver.   - Would recommend that patient have pneumococcal and yearly influenza vaccine through PCP if not done already.   - Would ask that PCP arrange bone density scan (DEXA scan) every 3 years with aggressive management of bone loss if present.    - Would ask that PCP obtain vitamin D level twice yearly and repletion if abnormal.    # Fluid retention/Ascites   - Low sodium (less than 2,0041mper day), high protein diet (1.6g/kg).   - Patient was seen by transplant dietician yesterday during transplant evaluation and given educational handouts on low sodium/high protein diets.   - In patients with ascites, avoid non-dipyridamole CCB and ace inhibitors as they worsen fluid retention  -  Home regimen includes: spironolactone 2552maily and furosemide 49m18mily, and weekly infusions of albumin.  - Hx of SBP: unknown  - Should continue to weigh self daily. Identified that patient should  contact office if greater than 5lb weight gain in one week.    In patients with diuretic-sensitive ascites, the removal of 5 liters of fluid is sufficient to reduce intraabdominal pressure, at which point sodium restriction and diuretics are continued   6 to 8 g of albumin should be given per liter of fluid removed in the case of larger-volume paracentesis, >5L    # Hepatic encephalopathy  - Continue lactulose and titrate for 3-4 large bowel movements per day  - Continue rifaximin 571m BID  - Hopefully with more renal protection may see reduction in episodes.  - Educated patient on importance of eating a high protein bedtime snack to reduce overnight muscle wasting  - Medications to Avoid: Benzos, NSAIDs, sedating medications  - Opioids should be used only when absolutely necessary, at lower dose and increased interval    # HMayoSurveillance   - Should have abdominal imaging and AFP every 6 months.   - Last AFP: <1 (07/26/2018).    # Variceal screening.   - Last EGD on 06/30/2018: Small esophageal varices that did not require further banding. Portal hypertensive gastropathy  - Recommend repeat in one year  - Has undergone banding in the past?: yes  - Is patient on beta blockers?: no     # Transplant Candidacy  - Too well at present, OLT would not improve her situation overall,  She and her family are comfortable with this.    We will see her back for clinical follow up in 6 months or sooner if medically necessary.      MElvin So MD   Medical Director: [facility]

## 2019-04-17 ENCOUNTER — Encounter: Payer: Self-pay | Admitting: Transplant

## 2019-04-17 NOTE — Progress Notes (Signed)
Spoke with Leta Speller daughter.  She has blood work every 3 months , albumin infusions every wed and copies go to [employer] and DR Nwosu ( her local GI).  She will ask the local GI to do ultrasounds every 6 months and make sure we recive a copy.  She has appointment with him on 04/24/19

## 2019-05-03 ENCOUNTER — Encounter: Payer: Self-pay | Admitting: Gastroenterology

## 2019-05-03 LAB — COMPREHENSIVE METABOLIC PANEL
ALT: 32 U/L
AST: 26 U/L
Albumin: 3.8 g/dL
Alk Phos: 137 U/L
Anion Gap: 6
Bilirubin,Total: 1.1 mg/dL
CO2: 31 mmol/L
Calcium: 9.6 mg/dL
Chloride: 107 mmol/L
Creatinine: 1.33 mg/dL
GFR,Caucasian: 39 *
Glucose: 196 mg/dL
Lab: 30 mg/dL
Potassium: 3.5 mmol/L
Sodium: 141 mmol/L
Total Protein: 7.6 g/dL

## 2019-05-03 LAB — CBC
Hematocrit: 35.2 %
Hemoglobin: 12 g/dL
MCV: 87.3 fL
Platelets: 69 10*3/uL
RBC: 4.03 MIL/uL
RDW: 15.1 %
WBC: 3.9 10*3/uL

## 2019-05-03 LAB — PROTIME-INR
AFP (eff. 4-2010): <2.5 IU/mL
INR: 1.25

## 2019-05-05 NOTE — Result Encounter Note (Signed)
Labs Reviewed Stable. MELD-Na score: 12 at 05/03/2019  8:28 AM  MELD score: 12 at 05/03/2019  8:28 AM  Calculated from:  Serum Creatinine: 1.33 mg/dL at 05/03/2019  8:28 AM  Serum Sodium: 141 mmol/L (Rounded to 137 mmol/L) at 05/03/2019  8:28 AM  Total Bilirubin: 1.1 mg/dL at 05/03/2019  8:28 AM  INR(ratio): 1.25 at 05/03/2019  8:28 AM  Age: 73 years 10 months

## 2019-05-31 ENCOUNTER — Encounter: Payer: Self-pay | Admitting: Gastroenterology

## 2019-05-31 LAB — COMPREHENSIVE METABOLIC PANEL
ALT: 33 U/L
AST: 28 U/L
Albumin: 3.7 g/dL
Alk Phos: 153 U/L
Anion Gap: 6
Bilirubin,Total: 1.2 mg/dL
CO2: 28 mmol/L
Calcium: 9.3 mg/dL
Chloride: 105 mmol/L
Creatinine: 1.47 mg/dL
GFR,Caucasian: 35 *
Glucose: 181 mg/dL
Lab: 29 mg/dL
Potassium: 3.6 mmol/L
Sodium: 139 mmol/L
Total Protein: 7.1 g/dL

## 2019-05-31 LAB — PROTIME-INR
AFP (eff. 4-2010): <2.5 IU/mL
INR: 1.27

## 2019-05-31 LAB — CBC
Hematocrit: 33 %
Hemoglobin: 11.4 g/dL
MCV: 87.8 fL
Platelets: 74 10*3/uL
RBC: 3.76 MIL/uL
RDW: 15.1 %
WBC: 5 10*3/uL

## 2019-06-04 NOTE — Result Encounter Note (Signed)
Labs Reviewed Stable. MELD-Na score: 14 at 05/31/2019  8:25 AM  MELD score: 14 at 05/31/2019  8:25 AM  Calculated from:  Serum Creatinine: 1.47 mg/dL at 05/31/2019  8:25 AM  Serum Sodium: 139 mmol/L (Rounded to 137 mmol/L) at 05/31/2019  8:25 AM  Total Bilirubin: 1.2 mg/dL at 05/31/2019  8:25 AM  INR(ratio): 1.27 at 05/31/2019  8:25 AM  Age: 73 years 11 months

## 2019-06-09 ENCOUNTER — Other Ambulatory Visit: Payer: Self-pay | Admitting: Transplant

## 2019-06-09 ENCOUNTER — Telehealth: Payer: Self-pay

## 2019-06-09 DIAGNOSIS — K7460 Unspecified cirrhosis of liver: Secondary | ICD-10-CM

## 2019-06-09 DIAGNOSIS — R188 Other ascites: Secondary | ICD-10-CM

## 2019-06-09 NOTE — Telephone Encounter (Signed)
Daughter called and procedure EGD was cancelled on 10/1 they were not aware, but she said that her mom could have been call but she is very confused and she updated her address.  She wants to know if this is still necessary to be rescheduled.

## 2019-06-09 NOTE — Telephone Encounter (Signed)
T/c with Ms. Cheyenne Butler Explained that it was ok for her to have EGD locally with Dr. Modesto Charon. If they would prefer [place] to do it that is fine as well. She will call us if they would like to reschedule here at [facility].

## 2019-06-28 ENCOUNTER — Telehealth: Payer: Self-pay | Admitting: Gastroenterology

## 2019-06-28 NOTE — Telephone Encounter (Signed)
Patient's daughter Raquel Sarna is calling to discuss patient's albumin infusions. She states they set up a procedure locally for 10/14 and she needs a covid test on 10/7. She states they were told she cannot leave the house for a week but she has an albumin infusion within that timeframe. They want to know if it's OK to skip that infusion or should they reschedule the procedure. She can be reached at [phone].

## 2019-06-29 ENCOUNTER — Encounter: Payer: Medicare Other | Admitting: Gastroenterology

## 2019-06-29 NOTE — Telephone Encounter (Signed)
Called pt her albumin is 3.5 -3.7.  She has alb infusions weekly . Can skip one week.

## 2019-06-29 NOTE — Telephone Encounter (Signed)
Patients daughter calling. Wondering about infusion and covid testing. She has been waiting for a call back and is requesting a call ASAP. She can be reached at [place].

## 2019-06-29 NOTE — Telephone Encounter (Signed)
Spoke with patient's daughter, Raquel Sarna, she states that her Mom is scheduled to have an endoscopy on 10/14, and her Father is scheduled for two surgeries around this timeframe.  Patient has Albumin Infusions weekly.  With the COVID restrictions, she will need to stay home, and will not be able to have her Albumin infusions.  They are wondering if they are able to skip the infusions, or should they reschedule procedure.  Please advise.  Patient was seen by Dr. Arrie Eastern.

## 2019-07-05 ENCOUNTER — Encounter: Payer: Self-pay | Admitting: Gastroenterology

## 2019-07-21 ENCOUNTER — Telehealth: Payer: Self-pay

## 2019-07-21 NOTE — Telephone Encounter (Signed)
-----   Message from [facility] sent at 07/20/2019  1:48 PM EDT -----    ----- Message -----  From: Jamelle Haring, NP  Sent: 05/05/2019   1:19 PM EDT  To: [facility] Staff Pool    Patient will need a 6 month follow up arranged [facility] 2021.  ----- Message -----  From: Lauree Chandler  Sent: 05/03/2019  12:50 PM EDT  To: Jamelle Haring, NP

## 2019-07-21 NOTE — Telephone Encounter (Signed)
Patient scheduled for [facility].

## 2019-09-25 ENCOUNTER — Telehealth: Payer: Self-pay | Admitting: Gastroenterology

## 2019-09-25 NOTE — Telephone Encounter (Signed)
Clarise Cruz patients daughter wanted to know if 1/5 appt can be changed to video visit. she states that someone just called mom and dad and they forgot what the message was about and would like to please  Call Clarise Cruz. She would like to be reached at [phone]

## 2019-09-25 NOTE — Telephone Encounter (Signed)
Copied from [place] [id number]. Topic: Appointments - Appointment Information  >> Sep 25, 2019  8:47 AM Anne Shutter wrote:  Pt daughter wantd to know if 1/5 appt can be changed to video visit. Pt daughter can be reached at [phone]

## 2019-10-02 ENCOUNTER — Encounter: Payer: Self-pay | Admitting: Gastroenterology

## 2019-10-02 LAB — CBC
Hematocrit: 34.2 %
Hemoglobin: 11.5 g/dL
INR: 1.25
MCV: 87.7 fL
Platelets: 84 10*3/uL
RBC: 3.9 MIL/uL
RDW: 14.2 %
WBC: 4.4 10*3/uL

## 2019-10-02 LAB — COMPREHENSIVE METABOLIC PANEL
AFP (eff. 4-2010): <2.5 IU/mL
ALT: 29 U/L
AST: 32 U/L
Albumin: 3.9 g/dL
Alk Phos: 155 U/L
Anion Gap: 6
Bilirubin,Total: 1 mg/dL
CO2: 29 mmol/L
Calcium: 9.3 mg/dL
Chloride: 106 mmol/L
Creatinine: 1.6 mg/dL
GFR,Caucasian: 32 mL/min
Glucose: 135 mg/dL
Lab: 29 mg/dL
Potassium: 3.7 mmol/L
Sodium: 141 mmol/L
Total Protein: 7.3 g/dL

## 2019-10-03 ENCOUNTER — Telehealth: Payer: Medicare Other | Admitting: Gastroenterology

## 2019-10-03 DIAGNOSIS — K7460 Unspecified cirrhosis of liver: Secondary | ICD-10-CM

## 2019-10-03 DIAGNOSIS — R188 Other ascites: Secondary | ICD-10-CM

## 2019-10-03 DIAGNOSIS — E119 Type 2 diabetes mellitus without complications: Secondary | ICD-10-CM

## 2019-10-03 DIAGNOSIS — I2510 Atherosclerotic heart disease of native coronary artery without angina pectoris: Secondary | ICD-10-CM

## 2019-10-03 DIAGNOSIS — I259 Chronic ischemic heart disease, unspecified: Secondary | ICD-10-CM

## 2019-10-03 DIAGNOSIS — I5189 Other ill-defined heart diseases: Secondary | ICD-10-CM

## 2019-10-03 NOTE — Progress Notes (Signed)
[facility] [place]    Referring Physician: Dr. Kimberlee Nearing  Primary Care Physician: [name], FNP  Primary Gastroenterologist: Dion Body, DO    I had the pleasure of speaking with  Cheyenne Butler in hepatology clinic in follow up via Televideo. As you know, she is a 74 y.o. 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  She was evaluated for OLT but felt to be best treated conservatively as OLT would likely not improve her overall situation.    Interval History:    Today the patient is doing very well,  She reports reduced baseline chronic fatigue and improved physical activity. Denies jaundice, icterus pruritis.  Her last paracentesis was done 10 months ago and that was her first and last one. She reports no further intermittent lower extremity edema.     Denies abdominal pain. Denies nausea, vomiting, fever, chills. Denies easy bruising, GI bleeding.  T  he patient and the family report frequent episodes of forgetfulness and confusion. She sleeps well. She is on lactulose and rifaximin, moving bowels 4-6 BMs daily, with the addition of miralax.    She was on the phone with her sister, and notes normal quality of life with no restrictions. She is fatigued after her albumin infusion but has no encephalopathy, no ascites or edema. She is quite pleased with where she is.    MELD-Na score: 14 at 10/02/2019  8:58 AM  MELD score: 14 at 10/02/2019  8:58 AM  Calculated from:  Serum Creatinine: 1.60 mg/dL at 10/02/2019  8:58 AM  Serum Sodium: 141 mmol/L (Rounded to 137 mmol/L) at 10/02/2019  8:58 AM  Total Bilirubin: 1.0 mg/dL at 10/02/2019  8:58 AM  INR(ratio): 1.25 at 10/02/2019  8:58 AM  Age: 59 years 3 months    Past Medical History:  Past Medical History:   Diagnosis Date    Angina pectoris     Bladder prolapse,  female, acquired     CAD (coronary artery disease)     Jan 2018 - stents placed (2)    Cirrhosis of liver with ascites, unspecified hepatic cirrhosis type     DM (diabetes mellitus), type 2     Dyslipidemia     Esophageal reflux     Grade II diastolic dysfunction     Hemorrhoid     High blood pressure     History of blood transfusion     Hypertension     Ischemic heart disease     Urinary incontinence         Past Surgical History:   Past Surgical History:   Procedure Laterality Date    CARDIAC CATHETERIZATION  10/2010    CORONARY ANGIOPLASTY WITH STENT PLACEMENT  09/2016    TUBAL LIGATION      variceal banding         Social History:   Social History     Tobacco Use    Smoking status: Never Smoker    Smokeless tobacco: Never Used   Substance Use Topics    Alcohol use: No       Family History:   family history includes Colon cancer in her brother.    Review of Systems   Constitutional: Positive for malaise/fatigue. Negative for chills, diaphoresis, fever and weight loss.   HENT: Negative.    Respiratory: Negative.    Cardiovascular: Negative.  Genitourinary: Negative.    Musculoskeletal: Negative.    Skin: Negative for itching and rash.       Vitals:   There were no vitals filed for this visit.  There is no height or weight on file to calculate BMI.    Physical Exam: via patient report    General appearance: alert, no change to age related changes,  Eyes: EOM, absence of icterus.  Gastrointestinal: abdomen soft, non-tender; no evidence of ascites,    Skin:She has absence of jaundice, presence of spider angiomas. No lesions.   Neurological: grossly normal, presence of mild asterixis  Extremities: extremities warm and dry. no edema    Functional Status:  Karnofsky Performance Status -  Score-50- Requires considerable assistance and frequent medical care.    LAB DATA:  Laboratory Data  Lab Results   Component Value Date    NA 141 10/02/2019    K 3.7 10/02/2019    CL 106 10/02/2019    CO2 29  10/02/2019    UN 29 10/02/2019    CREAT 1.60 10/02/2019     Lab Results   Component Value Date    ALK 155 10/02/2019    TB 1.0 10/02/2019    DB 0.2 07/26/2018    ALB 3.9 10/02/2019    ALT 29 10/02/2019    AST 32 10/02/2019     Lab Results   Component Value Date    WBC 4.4 10/02/2019    HGB 11.5 10/02/2019    HCT 34.20 10/02/2019    MCV 87.7 10/02/2019    PLT 84 10/02/2019     No results found for: LIP, AMY   Lab Results   Component Value Date    INR 1.25 10/02/2019       Blood type: A positive    Assessment:  Cheyenne Butler is a 74 y.o. 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  It was determined that she would not benefit from OLT and she is doing well with conservative Rx.     Plan:   #[place]/ EtOH Cirrhosis   - MELD-Na score: 14  - Update labs every month.   - Educated patient on importance of staying as active as possible to preserve functional status.  - Should avoid all herbal remedies and NSAIDs.   - Okay to take tylenol as needed for generalized aches/pains with a maximum daily dose of 2,024m per day.   - Educated patient that absolutely no alcohol and/or drugs that are not prescribed to patient are safe for liver.   - Would recommend that patient have pneumococcal and yearly influenza vaccine through PCP if not done already.   - Would ask that PCP arrange bone density scan (DEXA scan) every 3 years with aggressive management of bone loss if present.    - Would ask that PCP obtain vitamin D level twice yearly and repletion if abnormal.    # Fluid retention/Ascites   - Low sodium (less than 2,0066mper day), high protein diet (1.6g/kg).   - Patient was seen by transplant dietician yesterday during transplant evaluation and given educational handouts on low sodium/high protein diets.   - In patients with ascites, avoid non-dipyridamole CCB and ace  inhibitors as they worsen fluid retention  - Home regimen includes: spironolactone 2529maily and furosemide 39m50mily, and weekly infusions of albumin.  - Hx of SBP: unknown  -  Should continue to weigh self daily. Identified that patient should contact office if greater than 5lb weight gain in one week.    In patients with diuretic-sensitive ascites, the removal of 5 liters of fluid is sufficient to reduce intraabdominal pressure, at which point sodium restriction and diuretics are continued   6 to 8 g of albumin should be given per liter of fluid removed in the case of larger-volume paracentesis, >5L    # Hepatic encephalopathy  - Continue lactulose and titrate for 3-4 large bowel movements per day  - Continue rifaximin 561m BID  - Hopefully with more renal protection may see reduction in episodes.  - Educated patient on importance of eating a high protein bedtime snack to reduce overnight muscle wasting  - Medications to Avoid: Benzos, NSAIDs, sedating medications  - Opioids should be used only when absolutely necessary, at lower dose and increased interval    # [facility]   - Should have abdominal imaging and AFP every 6 months.   - Last AFP: <1 (07/26/2018).    # Variceal screening.   - Last EGD on 06/30/2018: Small esophageal varices that did not require further banding. Portal hypertensive gastropathy  - Recommend repeat in one year  - Has undergone banding in the past?: yes  - Is patient on beta blockers?: no     # Transplant Candidacy  - Too well at present, OLT would not improve her situation overall,  She and her family are comfortable with this.    We will see her back for clinical follow up in 6 months or sooner if medically necessary.      MElvin So MD   Medical Director: [facility]

## 2019-10-06 ENCOUNTER — Other Ambulatory Visit: Payer: Self-pay | Admitting: Transplant

## 2019-10-06 DIAGNOSIS — K7460 Unspecified cirrhosis of liver: Secondary | ICD-10-CM

## 2019-10-06 DIAGNOSIS — R188 Other ascites: Secondary | ICD-10-CM

## 2019-10-30 ENCOUNTER — Encounter: Payer: Self-pay | Admitting: Gastroenterology

## 2019-10-30 DIAGNOSIS — K7460 Unspecified cirrhosis of liver: Secondary | ICD-10-CM

## 2019-10-30 DIAGNOSIS — R188 Other ascites: Secondary | ICD-10-CM

## 2019-10-30 LAB — COMPREHENSIVE METABOLIC PANEL
ALT: 28 U/L
AST: 25 U/L
Albumin: 3.7 g/dL
Alk Phos: 143 U/L
Anion Gap: 5
Bilirubin,Total: 0.8 mg/dL
CO2: 29 mmol/L
Calcium: 9.2 mg/dL
Chloride: 106 mmol/L
Creatinine: 1.41 mg/dL
GFR,Caucasian: 37 mL/min/{1.73_m2}
Glucose: 202 mg/dL
Lab: 32 mg/dL
Potassium: 3.8 mmol/L
Sodium: 140 mmol/L
Total Protein: 7.4 g/dL

## 2019-10-30 LAB — PROTIME-INR: INR: 1.24

## 2019-10-31 NOTE — Result Encounter Note (Signed)
Labs Reviewed Stable. MELD-Na score: 12 at 10/30/2019  9:07 AM  MELD score: 12 at 10/30/2019  9:07 AM  Calculated from:  Serum Creatinine: 1.41 mg/dL at 10/30/2019  9:07 AM  Serum Sodium: 140 mmol/L (Rounded to 137 mmol/L) at 10/30/2019  9:07 AM  Total Bilirubin: 0.8 mg/dL (Rounded to 1 mg/dL) at 10/30/2019  9:07 AM  INR(ratio): 1.24 at 10/30/2019  9:07 AM  Age: 73 years 4 months

## 2019-11-02 DIAGNOSIS — K7460 Unspecified cirrhosis of liver: Secondary | ICD-10-CM

## 2019-11-02 DIAGNOSIS — R188 Other ascites: Secondary | ICD-10-CM

## 2019-11-02 LAB — CBC AND DIFFERENTIAL
Baso # K/uL: 0.1 10*3/uL
Basophil %: 1 %
Eos # K/uL: 0.1 10*3/uL
Eosinophil %: 2 %
Hematocrit: 31.8 %
Hemoglobin: 10.9 g/dL
Lymph # K/uL: 0.7 10*3/uL
Lymphocyte %: 14 %
Mono # K/uL: 0.4 10*3/uL
Monocyte %: 8 %
Neut # K/uL: 3.9 10*3/uL
Platelets: 70 10*3/uL
RBC: 3.64 MIL/uL
Seg Neut %: 75 %
WBC: 5.2 10*3/uL

## 2019-11-02 LAB — AFP TUMOR MARKER: AFP (eff. 4-2010): <2.5 IU/mL

## 2019-11-02 NOTE — Result Encounter Note (Signed)
Labs Reviewed Stable. MELD-Na score: 12 at 10/30/2019  9:07 AM  MELD score: 12 at 10/30/2019  9:07 AM  Calculated from:  Serum Creatinine: 1.41 mg/dL at 11/07/5619  3:08 AM  Serum Sodium: 140 mmol/L (Rounded to 137 mmol/L) at 10/30/2019  9:07 AM  Total Bilirubin: 0.8 mg/dL (Rounded to 1 mg/dL) at 03/02/7845  9:62 AM  INR(ratio): 1.24 at 10/30/2019  9:07 AM  Age: 74 years 4 months

## 2019-11-27 ENCOUNTER — Encounter: Payer: Self-pay | Admitting: Gastroenterology

## 2019-11-29 DIAGNOSIS — R188 Other ascites: Secondary | ICD-10-CM

## 2019-11-29 DIAGNOSIS — K7460 Unspecified cirrhosis of liver: Secondary | ICD-10-CM

## 2019-11-29 LAB — CBC AND DIFFERENTIAL
Baso # K/uL: 0 10*3/uL
Basophil %: 1 %
Eos # K/uL: 0.2 10*3/uL
Eosinophil %: 3 %
Hematocrit: 34.4 %
Hemoglobin: 11.7 g/dL
Lymph # K/uL: 0.8 10*3/uL
Lymphocyte %: 16 %
MCV: 88 fL
Mono # K/uL: 0.5 10*3/uL
Monocyte %: 10 %
Neut # K/uL: 3.8 10*3/uL
Platelets: 72 10*3/uL
RBC: 3.91 MIL/uL
RDW: 14.3 %
Seg Neut %: 71 %
WBC: 5.3 10*3/uL

## 2019-11-29 LAB — COMPREHENSIVE METABOLIC PANEL
ALT: 28 U/L
AST: 27 U/L
Alk Phos: 133 U/L
Anion Gap: 7
Bilirubin,Total: 1.2 mg/dL
CO2: 27 mmol/L
Calcium: 9.5 mg/dL
Chloride: 106 mmol/L
Creatinine: 1.4 mg/dL
GFR,Caucasian: 37 mL/min
Glucose: 146 mg/dL
Lab: 34 mg/dL
Potassium: 3.8 mmol/L
Sodium: 140 mmol/L

## 2019-11-29 LAB — PROTIME-INR: INR: 1.27

## 2019-11-30 LAB — AFP TUMOR MARKER: AFP (eff. 4-2010): <2.5 IU/mL

## 2019-12-01 NOTE — Result Encounter Note (Signed)
Labs Reviewed Stable. MELD-Na score: 13 at 11/27/2019 11:26 AM  MELD score: 13 at 11/27/2019 11:26 AM  Calculated from:  Serum Creatinine: 1.40 mg/dL at 04/02/2830 51:76 AM  Serum Sodium: 140 mmol/L (Rounded to 137 mmol/L) at 11/27/2019 11:26 AM  Total Bilirubin: 1.2 mg/dL at 10/04/735 10:62 AM  INR(ratio): 1.27 at 11/27/2019 11:26 AM  Age: 74 years 5 months

## 2020-01-01 ENCOUNTER — Encounter: Payer: Self-pay | Admitting: Gastroenterology

## 2020-01-03 LAB — AFP TUMOR MARKER: AFP (eff. 4-2010): <2.5 IU/mL

## 2020-01-05 ENCOUNTER — Telehealth: Payer: Self-pay

## 2020-01-05 NOTE — Telephone Encounter (Signed)
Attempted to call pt to discuss any further follow up from Dr. Alois Cliche or if she was planning to follow locally for her cirrhotic care.  Pt was not home and the person who answered the phone was not able to tell writer when she may be home.  Will call back at another time.

## 2020-01-10 NOTE — Telephone Encounter (Signed)
Called pt to follow up on her screening from the local GI office.  Pt states that she does have a local GI MD who does see her and is doing imaging.  Pt was last seen in January 2021 and would be due for follow up again in July 2021.  Asked pt if she still would like to be seen by Dr. Alois Cliche for follow up.  She would like to continue to see him, will have assistant call pt for an appt at [facility] office in July 2021.  Pt appreciative of phone call and follow up.

## 2020-01-17 NOTE — Telephone Encounter (Signed)
Left a message with her son for her to call and schedule follow up in [place] with Dr. Alois Cliche in July.

## 2020-02-27 ENCOUNTER — Encounter: Payer: Self-pay | Admitting: Gastroenterology

## 2020-03-06 LAB — CBC AND DIFFERENTIAL
Baso # K/uL: 0.1 10*3/uL
Basophil %: 1 %
Eos # K/uL: 0.3 10*3/uL
Eosinophil %: 4 %
Hematocrit: 33.9 %
Hemoglobin: 11.7 g/dL
Lymph # K/uL: 0.8 10*3/uL
Lymphocyte %: 12 %
MCV: 87.1 fL
Mono # K/uL: 0.5 10*3/uL
Monocyte %: 8 %
Neut # K/uL: 4.6 10*3/uL
Platelets: 107 10*3/uL
RBC: 3.89 MIL/uL
RDW: 16 %
Seg Neut %: 74 %
WBC: 6.2 10*3/uL

## 2020-03-07 LAB — COMPREHENSIVE METABOLIC PANEL
ALT: 28 U/L
AST: 42 U/L
Albumin: 3.2 g/dL
Alk Phos: 125 U/L
Anion Gap: 8
Bilirubin,Total: 1.2 mg/dL
CO2: 26 mmol/L
Calcium: 8.9 mg/dL
Chloride: 102 mmol/L
Creatinine: 1.25 mg/dL
GFR,Caucasian: 42 mL/min
Glucose: 118 mg/dL
Lab: 22 mg/dL
Potassium: 4 mmol/L
Sodium: 136 mmol/L
Total Protein: 7.3 g/dL

## 2020-03-07 LAB — PROTIME-INR
INR: 1.21
Protime: 13.9 s

## 2020-04-04 NOTE — Result Encounter Note (Signed)
Labs Reviewed Stable. MELD-Na score: 13 at 02/27/2020  8:32 AM  MELD score: 12 at 02/27/2020  8:32 AM  Calculated from:  Serum Creatinine: 1.25 mg/dL at 02/27/5637  9:37 AM  Serum Sodium: 136 mmol/L at 02/27/2020  8:32 AM  Total Bilirubin: 1.2 mg/dL at 11/30/2874  8:11 AM  INR(ratio): 1.21 at 02/27/2020  8:32 AM  Age: 74 years

## 2020-04-04 NOTE — Result Encounter Note (Signed)
Labs reviewed. Patient needs follow up. Was contacted in April. Will have office try again. Patient if following with local GI does not need to continue follow up with [place] at [facility].

## 2020-04-25 ENCOUNTER — Telehealth: Payer: Self-pay

## 2020-04-25 NOTE — Telephone Encounter (Signed)
Copied from CRM [id number]. Topic: Medications/Prescriptions - Medication Question/Problem  >> Apr 25, 2020  8:18 AM Zetta Bills wrote:  Patient called requesting to speak to a nurse regarding if the medication known as Asprin is alright to take please contact patient at [phone]

## 2020-04-26 NOTE — Telephone Encounter (Signed)
Attempted to call back pt to discuss her medication questions.  The person who answered the phone indicated that she was in the hospital and hung up phone with Clinical research associate.

## 2020-05-16 ENCOUNTER — Telehealth: Payer: Self-pay

## 2020-05-16 ENCOUNTER — Encounter: Payer: Self-pay | Admitting: Transplant

## 2020-05-16 NOTE — Progress Notes (Signed)
Received a call from pt daughter:    Cheyenne Butler [phone]), daughter of [name], called in because she stated that Cheyenne Butler received a call from this office yesterday letting her know that she is scheduled for surgery, and Maralyn Sago is very confusing as to what the individual was referring.    Please kindly return her call.       Her mother is confused a lot especially when she has a UTI.  She thought some called her regarding surgery.  Our Nurse Darel Hong called them regarding meds. She fels we should call her or Irving Burton not the pt. N Sabre Romberger

## 2020-05-16 NOTE — Telephone Encounter (Signed)
Wallene Dales [phone]), daughter of Camielle, called in because she stated that Yamari received a call from this office yesterday letting her know that she is scheduled for surgery, and Maralyn Sago is very confusing as to what the individual was referring.    Please kindly return her call.

## 2020-05-20 ENCOUNTER — Telehealth: Payer: Self-pay

## 2020-05-20 NOTE — Telephone Encounter (Signed)
Called pt's daughter regarding upcoming appt in [place] with Dr. Alois Cliche.  She was not aware of this appt and states that pt is seeing a liver MD in [place] and this is easier to get her to [place] vs coming to [place]. Pt is getting routine [place] and just had an EGD done recently.  Pt is not a transplant candidate and they do not feel that pt needs to be seen by Dr. Alois Cliche.  Let daughter know that writer will cancel appt with Dr. Alois Cliche and will not make a follow up at this time.  If something changes and they would like to follow with Dr. Alois Cliche the daughter will call us back.  Will update the provider.

## 2020-05-20 NOTE — Telephone Encounter (Signed)
Vm left for patient to call back to request information about imaging. Please ask the patient if she completed imaging outside of the [facility] system and if so if she could either bring the records or location so that we can retreive imaging prior to her appointment tomorrow. If there are any further questions please IM [place].

## 2020-05-21 ENCOUNTER — Telehealth: Payer: Medicare Other | Admitting: Gastroenterology

## 2020-09-12 ENCOUNTER — Telehealth: Payer: Self-pay

## 2020-09-12 NOTE — Telephone Encounter (Signed)
[employer] Calling in request to speak with Dr. Alois Cliche about a plan of care for this patient. Please call [phone] ask for Alcario Drought or Ripley Fraise.

## 2020-09-28 DEATH — deceased

## 2020-10-07 ENCOUNTER — Telehealth: Payer: Self-pay | Admitting: Gastroenterology

## 2020-10-07 NOTE — Telephone Encounter (Unsigned)
Copied from CRM #[id number]. Topic: Appointments - Appointment Information  >> Oct 07, 2020  1:49 PM Masoud Nyce, Bethann Berkshire wrote:  Patients daughter Marcelino Duster is calling to let the office know her mom has passed away.    Marcelino Duster can be reached at [phone]

## 4664-12-27 DEATH — deceased
